# Patient Record
Sex: Female | Born: 1970 | Race: Black or African American | Hispanic: No | Marital: Single | Smoking: Never smoker
Health system: Southern US, Community
[De-identification: ages and names within clinical notes are randomized; demographics above are authoritative.]

## PROBLEM LIST (undated history)

## (undated) DIAGNOSIS — J349 Unspecified disorder of nose and nasal sinuses: Secondary | ICD-10-CM

## (undated) DIAGNOSIS — N764 Abscess of vulva: Secondary | ICD-10-CM

## (undated) DIAGNOSIS — N6019 Diffuse cystic mastopathy of unspecified breast: Secondary | ICD-10-CM

## (undated) DIAGNOSIS — D649 Anemia, unspecified: Secondary | ICD-10-CM

## (undated) DIAGNOSIS — N907 Vulvar cyst: Secondary | ICD-10-CM

## (undated) DIAGNOSIS — M26629 Arthralgia of temporomandibular joint, unspecified side: Secondary | ICD-10-CM

## (undated) DIAGNOSIS — R011 Cardiac murmur, unspecified: Secondary | ICD-10-CM

## (undated) DIAGNOSIS — K219 Gastro-esophageal reflux disease without esophagitis: Secondary | ICD-10-CM

## (undated) HISTORY — DX: Abscess of vulva: N76.4

## (undated) HISTORY — DX: Arthralgia of temporomandibular joint, unspecified side: M26.629

## (undated) HISTORY — DX: Diffuse cystic mastopathy of unspecified breast: N60.19

## (undated) HISTORY — DX: Unspecified disorder of nose and nasal sinuses: J34.9

## (undated) HISTORY — DX: Gastro-esophageal reflux disease without esophagitis: K21.9

## (undated) HISTORY — DX: Cardiac murmur, unspecified: R01.1

## (undated) HISTORY — DX: Anemia, unspecified: D64.9

## (undated) HISTORY — DX: Vulvar cyst: N90.7

---
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
## 1998-08-25 ENCOUNTER — Other Ambulatory Visit: Admission: RE | Admit: 1998-08-25 | Discharge: 1998-08-25 | Payer: Self-pay | Admitting: Obstetrics and Gynecology

## 1999-09-19 ENCOUNTER — Other Ambulatory Visit: Admission: RE | Admit: 1999-09-19 | Discharge: 1999-09-19 | Payer: Self-pay | Admitting: Obstetrics

## 2000-09-17 ENCOUNTER — Other Ambulatory Visit: Admission: RE | Admit: 2000-09-17 | Discharge: 2000-09-17 | Payer: Self-pay | Admitting: Obstetrics

## 2001-04-25 ENCOUNTER — Encounter: Admission: RE | Admit: 2001-04-25 | Discharge: 2001-04-25 | Payer: Self-pay | Admitting: Obstetrics

## 2001-04-25 ENCOUNTER — Encounter: Payer: Self-pay | Admitting: Obstetrics

## 2006-01-25 ENCOUNTER — Emergency Department (HOSPITAL_COMMUNITY): Admission: EM | Admit: 2006-01-25 | Discharge: 2006-01-25 | Payer: Self-pay | Admitting: Family Medicine

## 2006-08-20 ENCOUNTER — Ambulatory Visit (HOSPITAL_COMMUNITY): Admission: RE | Admit: 2006-08-20 | Discharge: 2006-08-20 | Payer: Self-pay | Admitting: Obstetrics and Gynecology

## 2006-08-27 ENCOUNTER — Other Ambulatory Visit: Admission: RE | Admit: 2006-08-27 | Discharge: 2006-08-27 | Payer: Self-pay | Admitting: Obstetrics and Gynecology

## 2008-01-23 ENCOUNTER — Ambulatory Visit (HOSPITAL_COMMUNITY): Admission: RE | Admit: 2008-01-23 | Discharge: 2008-01-23 | Payer: Self-pay | Admitting: Family Medicine

## 2009-02-25 ENCOUNTER — Ambulatory Visit (HOSPITAL_COMMUNITY): Admission: RE | Admit: 2009-02-25 | Discharge: 2009-02-25 | Payer: Self-pay | Admitting: Internal Medicine

## 2009-10-22 DIAGNOSIS — N764 Abscess of vulva: Secondary | ICD-10-CM

## 2009-10-22 HISTORY — DX: Abscess of vulva: N76.4

## 2010-03-30 ENCOUNTER — Ambulatory Visit (HOSPITAL_COMMUNITY): Admission: RE | Admit: 2010-03-30 | Discharge: 2010-03-30 | Payer: Self-pay | Admitting: Internal Medicine

## 2010-10-22 DIAGNOSIS — M26629 Arthralgia of temporomandibular joint, unspecified side: Secondary | ICD-10-CM

## 2010-10-22 HISTORY — DX: Arthralgia of temporomandibular joint, unspecified side: M26.629

## 2011-07-02 ENCOUNTER — Other Ambulatory Visit (HOSPITAL_COMMUNITY): Payer: Self-pay | Admitting: Internal Medicine

## 2011-07-02 DIAGNOSIS — Z1231 Encounter for screening mammogram for malignant neoplasm of breast: Secondary | ICD-10-CM

## 2011-07-03 ENCOUNTER — Ambulatory Visit (HOSPITAL_COMMUNITY)
Admission: RE | Admit: 2011-07-03 | Discharge: 2011-07-03 | Disposition: A | Discharge status: Home / Self Care | Payer: 59 | Source: Ambulatory Visit | Attending: Internal Medicine | Admitting: Internal Medicine

## 2011-07-03 DIAGNOSIS — Z1231 Encounter for screening mammogram for malignant neoplasm of breast: Secondary | ICD-10-CM | POA: Insufficient documentation

## 2011-07-09 ENCOUNTER — Other Ambulatory Visit: Payer: Self-pay | Admitting: Internal Medicine

## 2011-07-09 DIAGNOSIS — R928 Other abnormal and inconclusive findings on diagnostic imaging of breast: Secondary | ICD-10-CM

## 2011-07-12 ENCOUNTER — Emergency Department (HOSPITAL_COMMUNITY)
Admission: EM | Admit: 2011-07-12 | Discharge: 2011-07-13 | Disposition: A | Discharge status: Home / Self Care | Payer: 59 | Attending: Emergency Medicine | Admitting: Emergency Medicine

## 2011-07-12 DIAGNOSIS — R209 Unspecified disturbances of skin sensation: Secondary | ICD-10-CM | POA: Insufficient documentation

## 2011-07-13 ENCOUNTER — Encounter (HOSPITAL_COMMUNITY): Payer: Self-pay

## 2011-07-13 ENCOUNTER — Emergency Department (HOSPITAL_COMMUNITY): Payer: 59

## 2011-07-13 LAB — POCT I-STAT, CHEM 8
Calcium, Ion: 1.22 mmol/L (ref 1.12–1.32)
HCT: 44 % (ref 36.0–46.0)
Sodium: 139 mEq/L (ref 135–145)
TCO2: 24 mmol/L (ref 0–100)

## 2011-07-13 LAB — URINALYSIS, ROUTINE W REFLEX MICROSCOPIC
Ketones, ur: NEGATIVE mg/dL
Leukocytes, UA: NEGATIVE
Nitrite: NEGATIVE
Protein, ur: NEGATIVE mg/dL
Urobilinogen, UA: 1 mg/dL (ref 0.0–1.0)

## 2011-07-13 LAB — POCT PREGNANCY, URINE: Preg Test, Ur: NEGATIVE

## 2011-07-17 ENCOUNTER — Ambulatory Visit
Admission: RE | Admit: 2011-07-17 | Discharge: 2011-07-17 | Disposition: A | Discharge status: Home / Self Care | Payer: 59 | Source: Ambulatory Visit | Attending: Internal Medicine | Admitting: Internal Medicine

## 2011-07-17 ENCOUNTER — Ambulatory Visit: Admission: RE | Admit: 2011-07-17 | Payer: 59 | Source: Ambulatory Visit

## 2011-07-17 ENCOUNTER — Other Ambulatory Visit: Payer: Self-pay | Admitting: Internal Medicine

## 2011-07-17 DIAGNOSIS — R928 Other abnormal and inconclusive findings on diagnostic imaging of breast: Secondary | ICD-10-CM

## 2011-09-06 ENCOUNTER — Other Ambulatory Visit: Payer: Self-pay | Admitting: Diagnostic Neuroimaging

## 2011-09-06 DIAGNOSIS — R42 Dizziness and giddiness: Secondary | ICD-10-CM

## 2011-09-06 DIAGNOSIS — R9089 Other abnormal findings on diagnostic imaging of central nervous system: Secondary | ICD-10-CM

## 2011-09-06 DIAGNOSIS — R200 Anesthesia of skin: Secondary | ICD-10-CM

## 2011-09-10 ENCOUNTER — Ambulatory Visit
Admission: RE | Admit: 2011-09-10 | Discharge: 2011-09-10 | Disposition: A | Discharge status: Home / Self Care | Payer: 59 | Source: Ambulatory Visit | Attending: Diagnostic Neuroimaging | Admitting: Diagnostic Neuroimaging

## 2011-09-10 DIAGNOSIS — R200 Anesthesia of skin: Secondary | ICD-10-CM

## 2011-09-10 DIAGNOSIS — R9089 Other abnormal findings on diagnostic imaging of central nervous system: Secondary | ICD-10-CM

## 2011-09-10 DIAGNOSIS — R42 Dizziness and giddiness: Secondary | ICD-10-CM

## 2011-09-10 NOTE — Patient Instructions (Signed)
Lumbar Puncture Discharge Instructions ° °1. Go home and rest quietly for the next 24 hours.  It is important to lie flat for the next 24 hours.  Get up only to go to the restroom.  You may lie in the bed or on a couch on your back, your stomach, your left side or your right side.  You may have one pillow under your head.  You may have pillows between your knees while you are on your side or under your knees while you are on your back. ° °2. DO NOT drive today.  Recline the seat as far back as it will go, while still wearing your seat belt, on the way home. ° °3. You may get up to go to the bathroom as needed.  You may sit up for 10 minutes to eat.  You may resume your normal diet and medications unless otherwise indicated. ° °4. The incidence of headache, nausea, or vomiting is about 5% (one in 20 patients).  If you develop a headache, lie flat and drink plenty of fluids until the headache goes away.  Caffeinated beverages may be helpful.  If you develop severe nausea and vomiting or a headache that does not go away with flat bed rest, call [phone]. ° °5. You may resume normal activities after your 24 hours of bed rest is over; however, do not exert yourself strongly or do any heavy lifting tomorrow. ° °6. Call your physician for a follow-up appointment.  The results of your myelogram will be sent directly to your physician by the following day. ° °7. If you have any questions or if complications develop after you arrive home, please call [phone]. ° °Discharge instructions have been explained to the patient.  The patient, or the person responsible for the patient, fully understands these instructions.  °

## 2011-09-10 NOTE — Progress Notes (Signed)
Pt's blood drawn for labs from right medial cubital vein, site unremarkable, pt tolerated procedure well. Discharge instructions explained to pt.

## 2011-10-30 ENCOUNTER — Ambulatory Visit (INDEPENDENT_AMBULATORY_CARE_PROVIDER_SITE_OTHER): Payer: 59 | Admitting: Surgery

## 2011-10-30 ENCOUNTER — Encounter (INDEPENDENT_AMBULATORY_CARE_PROVIDER_SITE_OTHER): Payer: Self-pay | Admitting: Surgery

## 2011-10-30 VITALS — BP 120/82 | HR 72 | Resp 16 | Ht 63.0 in | Wt 167.0 lb

## 2011-10-30 DIAGNOSIS — N6019 Diffuse cystic mastopathy of unspecified breast: Secondary | ICD-10-CM

## 2011-10-30 NOTE — Patient Instructions (Signed)
Be sure you have your mammogram as scheduled and see me if you have any other concerns with your breasts

## 2011-10-30 NOTE — Progress Notes (Signed)
NAME: Allison Farrell                                                                                      DOB: 13-Aug-1971 DATE: 10/30/2011               MRN: [id number]   CC:  Chief Complaint  Patient presents with  . Breast Pain    right    HPI:  Allison Farrell is a 41 y.o.  female who was referred  by Dr. Allen Derry evaluation of breast discomfort. She has been having painin her breast, right more than left. It is an aching, but sometimes more of a burning. She is not sure if it is related to menstrual cycle. She had a mammogram and ultrasound in Sep 2012 that was negative, but six month F/U recommended. She has no mass or nipple discharge. Her mother died at about 24 of breast cancer  PMH:  has a past medical history of Anemia.  PSH:   has past surgical history that includes Knee surgery (1990) and Wisdom tooth extraction (1991).  ALLERGIES:   Allergies  Allergen Reactions  . Shellfish Allergy Hives and Swelling  . Penicillins     Does not remember possibly when child    MEDICATIONS: Current outpatient prescriptions:acetaminophen (TYLENOL) 325 MG tablet, Take 650 mg by mouth as needed.  , Disp: , Rfl: ;  Multiple Vitamin (MULTIVITAMIN) tablet, Take 1 tablet by mouth daily.  , Disp: , Rfl:   ROS: She has filled out our 12 point review of systems and it is negative except for weakness   EXAM:   GENERAL:  The patient is alert, oriented, and generally healthy-appearing, NAD. Mood and affect are normal.  BREASTS:  Symmetric, non tender, firm, diffusely nodular, no dominant mass, not tender, no discharge, no skin changes  LYMPHATICS: No axillary or supraclavicular adenopathy    DATA REVIEWED:  Mammogram and ultrasound report  IMPRESSION:  Fibrocystic disease  PLAN:   No surgical indication. Discussed F/C disease and recommended she have mammogram as scheduled. Will see her PRN any other problems or concerns  Kerrie Timm J 10/30/2011  CC: Henreitta Leber, PA,  Elby Showers, MD, MD

## 2011-12-19 ENCOUNTER — Other Ambulatory Visit: Payer: Self-pay | Admitting: Internal Medicine

## 2011-12-19 DIAGNOSIS — R928 Other abnormal and inconclusive findings on diagnostic imaging of breast: Secondary | ICD-10-CM

## 2011-12-28 ENCOUNTER — Other Ambulatory Visit: Payer: Self-pay | Admitting: Internal Medicine

## 2011-12-28 DIAGNOSIS — S0300XA Dislocation of jaw, unspecified side, initial encounter: Secondary | ICD-10-CM

## 2012-01-03 ENCOUNTER — Ambulatory Visit
Admission: RE | Admit: 2012-01-03 | Discharge: 2012-01-03 | Disposition: A | Discharge status: Home / Self Care | Payer: 59 | Source: Ambulatory Visit | Attending: Internal Medicine | Admitting: Internal Medicine

## 2012-01-03 DIAGNOSIS — S0300XA Dislocation of jaw, unspecified side, initial encounter: Secondary | ICD-10-CM

## 2012-01-15 ENCOUNTER — Other Ambulatory Visit: Payer: Self-pay | Admitting: Internal Medicine

## 2012-01-15 ENCOUNTER — Ambulatory Visit
Admission: RE | Admit: 2012-01-15 | Discharge: 2012-01-15 | Disposition: A | Discharge status: Home / Self Care | Payer: 59 | Source: Ambulatory Visit | Attending: Internal Medicine | Admitting: Internal Medicine

## 2012-01-15 DIAGNOSIS — R928 Other abnormal and inconclusive findings on diagnostic imaging of breast: Secondary | ICD-10-CM

## 2012-03-11 ENCOUNTER — Encounter: Payer: Self-pay | Admitting: Obstetrics and Gynecology

## 2012-03-11 ENCOUNTER — Telehealth: Payer: Self-pay | Admitting: Obstetrics and Gynecology

## 2012-03-11 ENCOUNTER — Ambulatory Visit (INDEPENDENT_AMBULATORY_CARE_PROVIDER_SITE_OTHER): Payer: 59 | Admitting: Obstetrics and Gynecology

## 2012-03-11 VITALS — BP 110/72 | HR 78 | Ht 63.0 in | Wt 165.0 lb

## 2012-03-11 DIAGNOSIS — D649 Anemia, unspecified: Secondary | ICD-10-CM

## 2012-03-11 DIAGNOSIS — B356 Tinea cruris: Secondary | ICD-10-CM

## 2012-03-11 MED ORDER — NYSTATIN-TRIAMCINOLONE 100000-0.1 UNIT/GM-% EX OINT: TOPICAL_OINTMENT | Freq: Two times a day (BID) | CUTANEOUS | Status: DC

## 2012-03-11 NOTE — Patient Instructions (Signed)
Keep rash clean and dry.  Use cream twice daily for 7-14 days. Once healed, use Zeabsorb Powder to keep the area dry during  exercise and summer heat.

## 2012-03-11 NOTE — Progress Notes (Signed)
40 YO complains of pruritic rash on buttocks x 1 week. Started to improve but last night seemingly erupted.  Used tea tree oil, aloe vera gel, cortisone cream, H202, alcohol and antifungal cream.  Initially t was sore and saw a little blood on tissue.  Now feels like it is on fire.  Denies fever.   O: Buttocks: sacral area of natal cleft with well circumscribed pigmented dry macular rash with mild flaking on edges.  A: Tinea Cruris  P: Perineal Hygiene     Mycolog II (generic) topically to affected area bid x 7-     14 days no refills     Once area is healed, use Zeabsorb Powder in area     RTO as scheduled or prn     Melania Kirks J. Lowell Guitar, PA-C

## 2012-08-25 ENCOUNTER — Ambulatory Visit: Payer: 59 | Admitting: Obstetrics and Gynecology

## 2012-08-26 ENCOUNTER — Encounter: Payer: 59 | Admitting: Obstetrics and Gynecology

## 2012-08-26 ENCOUNTER — Ambulatory Visit: Payer: 59 | Admitting: Obstetrics and Gynecology

## 2012-09-17 ENCOUNTER — Encounter: Payer: Self-pay | Admitting: Obstetrics and Gynecology

## 2012-10-01 ENCOUNTER — Encounter: Payer: 59 | Admitting: Obstetrics and Gynecology

## 2012-10-23 ENCOUNTER — Ambulatory Visit: Payer: 59 | Admitting: Obstetrics and Gynecology

## 2012-11-19 ENCOUNTER — Ambulatory Visit: Payer: BC Managed Care – PPO | Admitting: Obstetrics and Gynecology

## 2012-11-19 ENCOUNTER — Encounter: Payer: Self-pay | Admitting: Obstetrics and Gynecology

## 2012-11-19 VITALS — BP 110/68 | Temp 98.3°F | Ht 63.0 in | Wt 173.0 lb

## 2012-11-19 DIAGNOSIS — Z01419 Encounter for gynecological examination (general) (routine) without abnormal findings: Secondary | ICD-10-CM

## 2012-11-19 DIAGNOSIS — Z124 Encounter for screening for malignant neoplasm of cervix: Secondary | ICD-10-CM

## 2012-11-19 NOTE — Progress Notes (Signed)
Regular Periods: yes Mammogram: yes  Monthly Breast Ex.: yes Exercise: yes  Tetanus < 10 years: yes Seatbelts: yes  NI. Bladder Functn.: yes Abuse at home: no  Daily BM's: yes Stressful Work: yes  Healthy Diet: yes Sigmoid-Colonoscopy: NO  Calcium: no Medical problems this year: NONE   LAST PAP:9/12  Contraception: ABST  Mammogram:  2012;FIROCYSTIC CHANGES  PCP: NO  PMH: NO CHANGE  FMH: NO CHANGE  Last Bone Scan: NO  PT IS SINGLE

## 2012-11-19 NOTE — Progress Notes (Signed)
Subjective:    Allison Farrell is a 42 y.o. female, G0P0, who presents for an annual exam. The patient reports no complaints but is considering having the "bumps" on her vaginal area removed.  In the past they have become infected and hurt but now they are "just there".  Menstrual cycle:   LMP: Patient's last menstrual period was 11/10/2012.             Review of Systems Pertinent items are noted in HPI. Denies pelvic pain, urinary tract symptoms, vaginitis symptoms, irregular bleeding, menopausal symptoms, change in bowel habits or rectal bleeding   Objective:    BP 110/68  Temp 98.3 F (36.8 C) (Oral)  Ht 5\' 3"  (1.6 m)  Wt 173 lb (78.472 kg)  BMI 30.65 kg/m2  LMP 11/10/2012    Wt Readings from Last 1 Encounters:  11/19/12 173 lb (78.472 kg)   Body mass index is 30.65 kg/(m^2). General Appearance: Alert, no acute distress HEENT: Grossly normal Neck / Thyroid: Supple, no thyromegaly or cervical adenopathy Lungs: Clear to auscultation bilaterally Back: No CVA tenderness Breast Exam: No masses or nodes.No dimpling, nipple retraction or discharge. Cardiovascular: Regular rate and rhythm. 2/6 SEM Gastrointestinal: Soft, non-tender, no masses or organomegaly Pelvic Exam: EGBUS-wnl except left inferior labia majora-2 mm inclusion cyst; left upper-inner labia majora with 2 mm inclusion cyst and right labial apex with 1 mm inclusion cyst;  vagina-normal rugae, cervix- without lesions or tenderness, uterus appears normal size shape and consistency, adnexae-no masses or tenderness Lymphatic Exam: Non-palpable nodes in neck, clavicular,  axillary, or inguinal regions  Skin: no rashes or abnormalities Extremities: no clubbing cyanosis or edema  Neurologic: grossly normal Psychiatric: Alert and oriented     Assessment:   Routine GYN Exam Labial Inclusion Cysts   Plan:  Patient may want inclusion cysts removed, has seen Dr. Stefano Gaul in the past  Requests names of PCPs, gave patient  the # for [facility] [phone]  PAP sent  RTO 1 year or prn  Corrion Stirewalt,ELMIRAPA-C

## 2012-11-20 LAB — PAP IG W/ RFLX HPV ASCU

## 2012-12-29 ENCOUNTER — Other Ambulatory Visit: Payer: Self-pay | Admitting: Obstetrics and Gynecology

## 2013-07-23 ENCOUNTER — Ambulatory Visit (HOSPITAL_COMMUNITY)
Admission: RE | Admit: 2013-07-23 | Discharge: 2013-07-23 | Disposition: A | Discharge status: Home / Self Care | Payer: BC Managed Care – PPO | Source: Ambulatory Visit | Attending: Obstetrics and Gynecology | Admitting: Obstetrics and Gynecology

## 2013-07-23 DIAGNOSIS — Z1231 Encounter for screening mammogram for malignant neoplasm of breast: Secondary | ICD-10-CM | POA: Insufficient documentation

## 2013-11-02 ENCOUNTER — Emergency Department (HOSPITAL_COMMUNITY)
Admission: EM | Admit: 2013-11-02 | Discharge: 2013-11-02 | Disposition: A | Discharge status: Home / Self Care | Payer: BC Managed Care – PPO | Source: Home / Self Care

## 2014-08-16 ENCOUNTER — Encounter: Payer: Self-pay | Admitting: Podiatry

## 2014-08-16 ENCOUNTER — Ambulatory Visit (INDEPENDENT_AMBULATORY_CARE_PROVIDER_SITE_OTHER): Payer: BC Managed Care – PPO | Admitting: Podiatry

## 2014-08-16 VITALS — BP 136/68 | HR 62 | Resp 16 | Ht 63.0 in | Wt 180.0 lb

## 2014-08-16 DIAGNOSIS — B351 Tinea unguium: Secondary | ICD-10-CM

## 2014-08-16 NOTE — Progress Notes (Signed)
Subjective:     Patient ID: Allison Farrell, female   DOB: January 10, 1971, 43 y.o.   MRN: [id number]  HPI patient presents with a dark left big toenail secondary to probable trauma with moderate fungal infiltration. States that she does not like the way it looks and also has some discoloration on the fifth nail   Review of Systems  All other systems reviewed and are negative.      Objective:   Physical Exam  Nursing note and vitals reviewed. Constitutional: She is oriented to person, place, and time.  Cardiovascular: Intact distal pulses.   Musculoskeletal: Normal range of motion.  Neurological: She is oriented to person, place, and time.  Skin: Skin is warm.   neurovascular status found to be intact with muscle strength adequate and range of motion subtalar and midtarsal joint within normal limits. Patient is noted to have a discolored left big toenail that is crusted and yellow with brittle debris noted and also moderate on the left fifth nail     Assessment:     Probable trauma with mycotic nail infection left big toenail and left fifth nail    Plan:     Reviewed condition and discussed treatment options including different topical antifungals or laser. At this time we have opted on topical and laser and I did explain this may or may not cure her condition area may consider oral at one point in the future

## 2014-08-16 NOTE — Progress Notes (Signed)
   Subjective:    Patient ID: Allison Farrell, female    DOB: 1971/07/31, 43 y.o.   MRN: [id number]  HPI Comments: "I have this bad nail"  Patient c/o thick, dark nail, 1st toe left, for several years. She has tried OTC meds-no help.     Review of Systems  Allergic/Immunologic: Positive for food allergies.  All other systems reviewed and are negative.      Objective:   Physical Exam        Assessment & Plan:

## 2014-08-16 NOTE — Patient Instructions (Signed)

## 2014-08-26 ENCOUNTER — Ambulatory Visit: Payer: BC Managed Care – PPO | Admitting: Podiatry

## 2014-08-26 ENCOUNTER — Encounter: Payer: Self-pay | Admitting: Podiatry

## 2014-08-26 DIAGNOSIS — B351 Tinea unguium: Secondary | ICD-10-CM

## 2014-08-26 NOTE — Patient Instructions (Signed)

## 2014-08-27 NOTE — Progress Notes (Signed)
Subjective:     Patient ID: Allison Farrell, female   DOB: 03/17/1971, 43 y.o.   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

## 2014-09-07 ENCOUNTER — Other Ambulatory Visit (HOSPITAL_COMMUNITY): Payer: Self-pay | Admitting: Obstetrics and Gynecology

## 2014-09-07 DIAGNOSIS — Z1231 Encounter for screening mammogram for malignant neoplasm of breast: Secondary | ICD-10-CM

## 2014-09-20 ENCOUNTER — Telehealth: Payer: Self-pay | Admitting: *Deleted

## 2014-09-20 NOTE — Telephone Encounter (Signed)
Pt states her checkout paperwork states her next laser appt #2 was to be 6 weeks out, but her appt is this Thursday 09/23/2014, which is 4 weeks.  Is that okay?  Dr. Paulla Dolly states okay.  Please leave a message.  I left a message with Dr. Mellody Drown statement.

## 2014-09-23 ENCOUNTER — Ambulatory Visit (INDEPENDENT_AMBULATORY_CARE_PROVIDER_SITE_OTHER): Payer: BC Managed Care – PPO | Admitting: Podiatry

## 2014-09-23 DIAGNOSIS — B351 Tinea unguium: Secondary | ICD-10-CM

## 2014-09-23 NOTE — Progress Notes (Signed)
Subjective:     Patient ID: Allison Farrell, female   DOB: 1971/05/25, 43 y.o.   MRN: [id number]  HPI patient states I'm improving with my nails but still present   Review of Systems     Objective:   Physical Exam Neurovascular status intact with discoloration of the big toenail fifth nail left localized in nature    Assessment:     Mycotic nail infection that appears to be improving    Plan:     Laser to the left first and fifth nailbeds approximate 1100 pulses

## 2014-09-29 ENCOUNTER — Ambulatory Visit (HOSPITAL_COMMUNITY)
Admission: RE | Admit: 2014-09-29 | Discharge: 2014-09-29 | Disposition: A | Discharge status: Home / Self Care | Payer: BC Managed Care – PPO | Source: Ambulatory Visit | Attending: Obstetrics and Gynecology | Admitting: Obstetrics and Gynecology

## 2014-09-29 DIAGNOSIS — Z1231 Encounter for screening mammogram for malignant neoplasm of breast: Secondary | ICD-10-CM | POA: Diagnosis present

## 2015-01-27 ENCOUNTER — Ambulatory Visit: Payer: BC Managed Care – PPO | Admitting: Podiatry

## 2015-09-09 ENCOUNTER — Other Ambulatory Visit: Payer: Self-pay | Admitting: Obstetrics and Gynecology

## 2015-09-09 DIAGNOSIS — Z803 Family history of malignant neoplasm of breast: Secondary | ICD-10-CM

## 2015-10-03 ENCOUNTER — Ambulatory Visit
Admission: RE | Admit: 2015-10-03 | Discharge: 2015-10-03 | Disposition: A | Discharge status: Home / Self Care | Payer: BC Managed Care – PPO | Source: Ambulatory Visit | Attending: Obstetrics and Gynecology | Admitting: Obstetrics and Gynecology

## 2015-10-03 DIAGNOSIS — Z803 Family history of malignant neoplasm of breast: Secondary | ICD-10-CM

## 2015-10-03 MED ORDER — GADOBENATE DIMEGLUMINE 529 MG/ML IV SOLN
17.0000 mL | Freq: Once | INTRAVENOUS | Status: AC | PRN
  Administered 2015-10-03: 17 mL via INTRAVENOUS

## 2015-10-27 ENCOUNTER — Ambulatory Visit: Payer: BC Managed Care – PPO | Admitting: Podiatry

## 2015-10-31 ENCOUNTER — Ambulatory Visit (INDEPENDENT_AMBULATORY_CARE_PROVIDER_SITE_OTHER): Payer: BC Managed Care – PPO | Admitting: Emergency Medicine

## 2015-10-31 VITALS — BP 168/90 | HR 63 | Temp 98.8°F | Resp 16 | Ht 63.5 in | Wt 176.0 lb

## 2015-10-31 DIAGNOSIS — K2970 Gastritis, unspecified, without bleeding: Secondary | ICD-10-CM | POA: Diagnosis not present

## 2015-10-31 DIAGNOSIS — N6019 Diffuse cystic mastopathy of unspecified breast: Secondary | ICD-10-CM | POA: Diagnosis not present

## 2015-10-31 DIAGNOSIS — K2990 Gastroduodenitis, unspecified, without bleeding: Secondary | ICD-10-CM

## 2015-10-31 LAB — CBC
HEMATOCRIT: 42.6 % (ref 36.0–46.0)
HEMOGLOBIN: 13.1 g/dL (ref 12.0–15.0)
MCH: 22.7 pg — AB (ref 26.0–34.0)
MCHC: 30.8 g/dL (ref 30.0–36.0)
MCV: 73.8 fL — AB (ref 78.0–100.0)
MPV: 9.6 fL (ref 8.6–12.4)
Platelets: 340 10*3/uL (ref 150–400)
RBC: 5.77 MIL/uL — AB (ref 3.87–5.11)
RDW: 14.1 % (ref 11.5–15.5)
WBC: 4.5 10*3/uL (ref 4.0–10.5)

## 2015-10-31 LAB — COMPREHENSIVE METABOLIC PANEL
ALBUMIN: 3.7 g/dL (ref 3.6–5.1)
ALK PHOS: 50 U/L (ref 33–115)
ALT: 16 U/L (ref 6–29)
AST: 15 U/L (ref 10–30)
BUN: 11 mg/dL (ref 7–25)
CALCIUM: 9.2 mg/dL (ref 8.6–10.2)
CHLORIDE: 102 mmol/L (ref 98–110)
CO2: 28 mmol/L (ref 20–31)
Creat: 0.73 mg/dL (ref 0.50–1.10)
Glucose, Bld: 88 mg/dL (ref 65–99)
POTASSIUM: 4.4 mmol/L (ref 3.5–5.3)
Sodium: 137 mmol/L (ref 135–146)
TOTAL PROTEIN: 7.1 g/dL (ref 6.1–8.1)
Total Bilirubin: 0.4 mg/dL (ref 0.2–1.2)

## 2015-10-31 MED ORDER — LANSOPRAZOLE 30 MG PO CPDR: 30.0000 mg | DELAYED_RELEASE_CAPSULE | Freq: Every day | ORAL | Status: DC

## 2015-10-31 NOTE — Progress Notes (Signed)
Subjective:  Patient ID: Allison Farrell, female    DOB: Sep 14, 1971  Age: 45 y.o. MRN: [id number]  CC: Itching under arms; Breast Pain; and Burning in back   HPI Allison Farrell presents   Patient has a number of unrelated complaints. She had no nausea vomiting diarrhea that was self-limited back just before Christmas. She is concerned that she doesn't "feel well. Although her symptoms of been resolved For several weeks. She has epigastric discomfort. Denies any alcohol or excess caffeine overuse.  She does nottake Aleve or ibuprofen or aspirin in excess. She had poor appetite. She's not taking any medications that are unprovoked of pancreatitis. Has no fever or chills. Has no blood in her stool or black stools   she has a history of fibrocystic disease in her in her breast and has noted increased pain in the right breast. Is no drainage or redness or swelling.    he was treated previously with a fungus infection by her gynecologist in her axillae. Treated with a antifungal powder and her symptoms resolved. Now she's com back in with recurrence although it's largely resolved again with another round of antifungal powder  History Allison Farrell has a past medical history of Anemia; Fibrocystic breast; TMJ arthralgia (2012); Labial abscess (2011); and Inclusion cyst of vulva.   She has past surgical history that includes Knee surgery (1990) and Wisdom tooth extraction (1991).   Her  family history includes Breast cancer (age of onset: 23) in her mother; Cancer in her father and mother; Diabetes in her brother and father; Hypertension in her father; Kidney disease in her paternal grandmother; Lung cancer in her father; Stroke in her maternal grandmother and paternal grandmother.  She   reports that she has never smoked. She has never used smokeless tobacco. She reports that she drinks about 1.8 oz of alcohol per week. She reports that she does not use illicit drugs.  Outpatient Prescriptions Prior to Visit   Medication Sig Dispense Refill  . Multiple Vitamin (MULTIVITAMIN) tablet Take 1 tablet by mouth daily. Reported on 10/31/2015     No facility-administered medications prior to visit.    Social History   Social History  . Marital Status: Single    Spouse Name: N/A  . Number of Children: N/A  . Years of Education: N/A   Social History Main Topics  . Smoking status: Never Smoker   . Smokeless tobacco: Never Used  . Alcohol Use: 1.8 oz/week    3 Standard drinks or equivalent per week     Comment: occasionally  . Drug Use: No  . Sexual Activity: Not Currently    Birth Control/ Protection: Abstinence   Other Topics Concern  . None   Social History Narrative     Review of Systems  Constitutional: Positive for appetite change. Negative for fever and chills.  HENT: Negative for congestion, ear pain, postnasal drip, sinus pressure and sore throat.   Eyes: Negative for pain and redness.  Respiratory: Negative for cough, shortness of breath and wheezing.   Cardiovascular: Negative for leg swelling.  Gastrointestinal: Positive for nausea. Negative for vomiting, abdominal pain, diarrhea, constipation and blood in stool.  Endocrine: Negative for polyuria.  Genitourinary: Negative for dysuria, urgency, frequency and flank pain.  Musculoskeletal: Negative for gait problem.  Skin: Negative for rash.  Neurological: Negative for weakness and headaches.  Psychiatric/Behavioral: Negative for confusion and decreased concentration. The patient is not nervous/anxious.     Objective:  BP 150/94 mmHg  Pulse 63  Temp(Src) 98.8 F (37.1 C) (Oral)  Resp 16  Ht 5' 3.5" (1.613 m)  Wt 176 lb (79.833 kg)  BMI 30.68 kg/m2  SpO2 97%  LMP 10/17/2015  Physical Exam  Constitutional: She is oriented to person, place, and time. She appears well-developed and well-nourished. No distress.  HENT:  Head: Normocephalic and atraumatic.  Right Ear: External ear normal.  Left Ear: External ear normal.    Nose: Nose normal.  Eyes: Conjunctivae and EOM are normal. Pupils are equal, round, and reactive to light. No scleral icterus.  Neck: Normal range of motion. Neck supple. No tracheal deviation present.  Cardiovascular: Normal rate, regular rhythm and normal heart sounds.   Pulmonary/Chest: Effort normal. No respiratory distress. She has no wheezes. She has no rales. Right breast exhibits mass (Diffuse fibrocystic disease).  Abdominal: She exhibits no mass. There is no tenderness. There is no rebound and no guarding.  Musculoskeletal: She exhibits no edema.  Lymphadenopathy:    She has no cervical adenopathy.  Neurological: She is alert and oriented to person, place, and time. Coordination normal.  Skin: Skin is warm and dry. No rash noted.  Psychiatric: She has a normal mood and affect. Her behavior is normal.      Assessment & Plan:   Larra was seen today for itching under arms, breast pain and burning in back.  Diagnoses and all orders for this visit:  Gastritis and gastroduodenitis -     CBC -     Comprehensive metabolic panel  Fibrocystic breast disease, unspecified laterality -     CBC -     Comprehensive metabolic panel  Other orders -     lansoprazole (PREVACID) 30 MG capsule; Take 1 capsule (30 mg total) by mouth daily at 12 noon.  I am having Allison Farrell start on lansoprazole. I am also having her maintain her multivitamin.  Meds ordered this encounter  Medications  . lansoprazole (PREVACID) 30 MG capsule    Sig: Take 1 capsule (30 mg total) by mouth daily at 12 noon.    Dispense:  30 capsule    Refill:  5    Appropriate red flag conditions were discussed with the patient as well as actions that should be taken.  Patient expressed his understanding.  Follow-up: Return if symptoms worsen or fail to improve.  Roselee Culver, MD

## 2015-10-31 NOTE — Patient Instructions (Signed)

## 2015-11-02 ENCOUNTER — Ambulatory Visit (INDEPENDENT_AMBULATORY_CARE_PROVIDER_SITE_OTHER): Payer: BC Managed Care – PPO | Admitting: Podiatry

## 2015-11-02 ENCOUNTER — Encounter: Payer: Self-pay | Admitting: Podiatry

## 2015-11-02 VITALS — BP 127/74 | HR 67 | Resp 16

## 2015-11-02 DIAGNOSIS — B351 Tinea unguium: Secondary | ICD-10-CM

## 2015-11-02 MED ORDER — TERBINAFINE HCL 250 MG PO TABS: ORAL_TABLET | ORAL | Status: DC

## 2015-11-02 NOTE — Progress Notes (Signed)
Subjective:     Patient ID: Allison Farrell, female   DOB: 24-May-1971, 45 y.o.   MRN: [id number]  HPI patient presents for third laser treatment stating there might be mild improvement but there still nail disease and also I'm getting some skin irritation   Review of Systems     Objective:   Physical Exam Neurovascular status unchanged with nail disease hallux fifth nail left and mild skin changes which can be a fungal type element    Assessment:     Mycotic disease along with fungal element    Plan:     Reviewed condition laser will be administered today we'll start on a pulse Lamisil therapy. Patient be seen back to recheck again in 4 months

## 2016-02-09 ENCOUNTER — Ambulatory Visit (INDEPENDENT_AMBULATORY_CARE_PROVIDER_SITE_OTHER): Payer: BC Managed Care – PPO | Admitting: Physician Assistant

## 2016-02-09 VITALS — BP 122/72 | HR 60 | Temp 100.6°F | Resp 18 | Ht 63.5 in | Wt 180.8 lb

## 2016-02-09 DIAGNOSIS — E860 Dehydration: Secondary | ICD-10-CM | POA: Diagnosis not present

## 2016-02-09 DIAGNOSIS — J111 Influenza due to unidentified influenza virus with other respiratory manifestations: Secondary | ICD-10-CM | POA: Diagnosis not present

## 2016-02-09 DIAGNOSIS — R319 Hematuria, unspecified: Secondary | ICD-10-CM | POA: Diagnosis not present

## 2016-02-09 DIAGNOSIS — R69 Illness, unspecified: Principal | ICD-10-CM

## 2016-02-09 LAB — POCT URINALYSIS DIP (MANUAL ENTRY)
Glucose, UA: NEGATIVE
Leukocytes, UA: NEGATIVE
Nitrite, UA: NEGATIVE
UROBILINOGEN UA: 0.2
pH, UA: 5.5

## 2016-02-09 LAB — POCT CBC
GRANULOCYTE PERCENT: 82.3 % — AB (ref 37–80)
HCT, POC: 39.8 % (ref 37.7–47.9)
HEMOGLOBIN: 13.2 g/dL (ref 12.2–16.2)
Lymph, poc: 0.5 — AB (ref 0.6–3.4)
MCH: 23.8 pg — AB (ref 27–31.2)
MCHC: 33.2 g/dL (ref 31.8–35.4)
MCV: 71.8 fL — AB (ref 80–97)
MID (cbc): 0.3 (ref 0–0.9)
MPV: 6.6 fL (ref 0–99.8)
PLATELET COUNT, POC: 213 10*3/uL (ref 142–424)
POC GRANULOCYTE: 3.8 (ref 2–6.9)
POC LYMPH %: 10.8 % (ref 10–50)
POC MID %: 6.9 %M (ref 0–12)
RBC: 5.54 M/uL — AB (ref 4.04–5.48)
RDW, POC: 14 %
WBC: 4.6 10*3/uL (ref 4.6–10.2)

## 2016-02-09 LAB — POCT INFLUENZA A/B
INFLUENZA A, POC: NEGATIVE
Influenza B, POC: NEGATIVE

## 2016-02-09 LAB — POC MICROSCOPIC URINALYSIS (UMFC)

## 2016-02-09 MED ORDER — OSELTAMIVIR PHOSPHATE 75 MG PO CAPS
75.0000 mg | ORAL_CAPSULE | Freq: Two times a day (BID) | ORAL | Status: DC
Start: 2016-02-09 — End: 2016-02-11

## 2016-02-09 NOTE — Progress Notes (Signed)
02/09/2016 10:15 AM   DOB: 08/19/1971 / MRN: [id number]  SUBJECTIVE:  Allison Farrell is a 45 y.o. female presenting for body aches about her neck and hips, HA, and fatigue that started 48 hours ago.  She has not had the seasonal flu shot and works in a middle school.  She associates fever, nausea and dry heaving. Has tried OTC cough prep with some relief of her symptoms.    She is allergic to shellfish allergy and penicillins.   She  has a past medical history of Anemia; Fibrocystic breast; TMJ arthralgia (2012); Labial abscess (2011); and Inclusion cyst of vulva.    She  reports that she has never smoked. She has never used smokeless tobacco. She reports that she drinks about 1.8 oz of alcohol per week. She reports that she does not use illicit drugs. She  reports that she does not currently engage in sexual activity. She reports using the following method of birth control/protection: Abstinence. The patient  has past surgical history that includes Knee surgery (1990) and Wisdom tooth extraction (1991).  Her family history includes Breast cancer (age of onset: 16) in her mother; Cancer in her father and mother; Diabetes in her brother and father; Hypertension in her father; Kidney disease in her paternal grandmother; Lung cancer in her father; Stroke in her maternal grandmother and paternal grandmother.  Review of Systems  Constitutional: Negative for diaphoresis.  HENT: Positive for congestion. Negative for sore throat.   Respiratory: Negative for cough and shortness of breath.   Cardiovascular: Negative for chest pain.  Genitourinary: Negative for dysuria, urgency and frequency.  Skin: Negative for rash.  Neurological: Negative for dizziness.    Problem list and medications reviewed and updated by myself where necessary, and exist elsewhere in the encounter.   OBJECTIVE:  BP 122/72 mmHg  Pulse 60  Temp(Src) 100.6 F (38.1 C) (Oral)  Resp 18  Ht 5' 3.5" (1.613 m)  Wt 180 lb 12.8 oz  (82.01 kg)  BMI 31.52 kg/m2  SpO2 98%  LMP 01/27/2016  Physical Exam  Constitutional: She is oriented to person, place, and time. She appears well-nourished. No distress.  Eyes: EOM are normal. Pupils are equal, round, and reactive to light.  Cardiovascular: Normal rate and regular rhythm.   Pulmonary/Chest: Effort normal and breath sounds normal. No respiratory distress. She has no wheezes. She has no rales. She exhibits no tenderness.  Abdominal: She exhibits no distension and no mass. There is no tenderness. There is no rebound and no guarding.  Neurological: She is alert and oriented to person, place, and time. No cranial nerve deficit. Gait normal.  Skin: Skin is dry. She is not diaphoretic.  Psychiatric: She has a normal mood and affect.  Vitals reviewed.   Results for orders placed or performed in visit on 02/09/16 (from the past 72 hour(s))  POCT Influenza A/B     Status: None   Collection Time: 02/09/16  8:55 AM  Result Value Ref Range   Influenza A, POC Negative Negative   Influenza B, POC Negative Negative  POCT CBC     Status: Abnormal   Collection Time: 02/09/16  8:55 AM  Result Value Ref Range   WBC 4.6 4.6 - 10.2 K/uL   Lymph, poc 0.5 (A) 0.6 - 3.4   POC LYMPH PERCENT 10.8 10 - 50 %L   MID (cbc) 0.3 0 - 0.9   POC MID % 6.9 0 - 12 %M   POC Granulocyte 3.8 2 -  6.9   Granulocyte percent 82.3 (A) 37 - 80 %G   RBC 5.54 (A) 4.04 - 5.48 M/uL   Hemoglobin 13.2 12.2 - 16.2 g/dL   HCT, POC 39.8 37.7 - 47.9 %   MCV 71.8 (A) 80 - 97 fL   MCH, POC 23.8 (A) 27 - 31.2 pg   MCHC 33.2 31.8 - 35.4 g/dL   RDW, POC 14.0 %   Platelet Count, POC 213 142 - 424 K/uL   MPV 6.6 0 - 99.8 fL  POCT urinalysis dipstick     Status: Abnormal   Collection Time: 02/09/16  8:55 AM  Result Value Ref Range   Color, UA yellow yellow   Clarity, UA clear clear   Glucose, UA negative negative   Bilirubin, UA small (A) negative   Ketones, POC UA small (15) (A) negative   Spec Grav, UA >=1.030     Blood, UA large (A) negative   pH, UA 5.5    Protein Ur, POC =100 (A) negative   Urobilinogen, UA 0.2    Nitrite, UA Negative Negative   Leukocytes, UA Negative Negative  POCT Microscopic Urinalysis ([facility])     Status: Abnormal   Collection Time: 02/09/16  9:37 AM  Result Value Ref Range   WBC,UR,HPF,POC None None WBC/hpf   RBC,UR,HPF,POC Moderate (A) None RBC/hpf   Bacteria None None, Too numerous to count   Mucus Present (A) Absent   Epithelial Cells, UR Per Microscopy Few (A) None, Too numerous to count cells/hpf    No results found.  ASSESSMENT AND PLAN  Allison Farrell was seen today for generalized body aches, nausea and chills.  Diagnoses and all orders for this visit:  Influenza-like illness: Rapid negative however CBC points in a direction of flu.  Her symptoms are consistent with flu B.  Will treat.  She is dehydrated per her urine.  Will provide one liter here today and advised that she stay out of work until Monday and focus on hydration and rest.  RTC if symptoms change or worsen.   -     POCT Influenza A/B -     POCT CBC -     POCT urinalysis dipstick -     oseltamivir (TAMIFLU) 75 MG capsule; Take 1 capsule (75 mg total) by mouth 2 (two) times daily.  Dehydration -     Insert peripheral IV  Hematuria:  Doubt UTI as there are no leuks or nitrites.  She has no symptoms of UTI. CBC points in a viral direction and her urine shows significant dehydration.  Will re-eval the urine in 2 days.  I can not rule out a stone at this time however it would be an odd presentation.      The patient was advised to call or return to clinic if she does not see an improvement in symptoms or to seek the care of the closest emergency department if she worsens with the above plan.   Philis Fendt, MHS, PA-C [facility] 02/09/2016 10:15 AM

## 2016-02-09 NOTE — Patient Instructions (Addendum)
Please take 600-800 mg of Ibuprofen every 8 hours.  Please focus on taking small sips of fluid often while resting at home.  If your symptoms change or worsen over the next few days then please return to clinic.      IF you received an x-ray today, you will receive an invoice from [facility]. Please contact [facility] at [phone] with questions or concerns regarding your invoice.   IF you received labwork today, you will receive an invoice from [employer]. Please contact [facility] at [phone] with questions or concerns regarding your invoice.   Our billing staff will not be able to assist you with questions regarding bills from these companies.  You will be contacted with the lab results as soon as they are available. The fastest way to get your results is to activate your My Chart account. Instructions are located on the last page of this paperwork. If you have not heard from [place] regarding the results in 2 weeks, please contact this office.

## 2016-02-11 ENCOUNTER — Ambulatory Visit (INDEPENDENT_AMBULATORY_CARE_PROVIDER_SITE_OTHER): Payer: BC Managed Care – PPO

## 2016-02-11 ENCOUNTER — Ambulatory Visit (HOSPITAL_COMMUNITY)
Admission: EM | Admit: 2016-02-11 | Discharge: 2016-02-11 | Disposition: A | Discharge status: Home / Self Care | Payer: BC Managed Care – PPO | Attending: Emergency Medicine | Admitting: Emergency Medicine

## 2016-02-11 ENCOUNTER — Encounter (HOSPITAL_COMMUNITY): Payer: Self-pay

## 2016-02-11 DIAGNOSIS — J111 Influenza due to unidentified influenza virus with other respiratory manifestations: Secondary | ICD-10-CM

## 2016-02-11 DIAGNOSIS — T887XXA Unspecified adverse effect of drug or medicament, initial encounter: Secondary | ICD-10-CM | POA: Diagnosis not present

## 2016-02-11 DIAGNOSIS — T50905A Adverse effect of unspecified drugs, medicaments and biological substances, initial encounter: Secondary | ICD-10-CM

## 2016-02-11 DIAGNOSIS — T375X5A Adverse effect of antiviral drugs, initial encounter: Secondary | ICD-10-CM | POA: Diagnosis not present

## 2016-02-11 LAB — POCT URINALYSIS DIP (DEVICE)
GLUCOSE, UA: NEGATIVE mg/dL
KETONES UR: NEGATIVE mg/dL
Leukocytes, UA: NEGATIVE
Nitrite: NEGATIVE
Protein, ur: 100 mg/dL — AB
Urobilinogen, UA: 0.2 mg/dL (ref 0.0–1.0)
pH: 5.5 (ref 5.0–8.0)

## 2016-02-11 NOTE — Discharge Instructions (Signed)
You are having a reaction to the Tamiflu. Please stop the Tamiflu. I have added this to your allergy list. Alternate Tylenol and ibuprofen every 4 hours for the fever and body aches. Get plenty of rest and drink plenty of fluids. The rash should improve in the next 2-3 days. You can take Benadryl as needed. Please follow-up here or with your primary doctor on Monday or Tuesday for recheck, sooner if things change.

## 2016-02-11 NOTE — ED Notes (Signed)
Patient presents with flu-like symptoms, she went to urgent care on 02/09/2016 and was diagnosed with flu and prescribed Tamiflu and she has noticed a rash breakout since Friday 02/10/2016. Patient is having pain in head and she complains of ears burning. No acute distress

## 2016-02-11 NOTE — ED Provider Notes (Signed)
CSN: [id number]     Arrival date & time 02/11/16  1744 History   First MD Initiated Contact with Patient 02/11/16 1755     Chief Complaint  Patient presents with  . Influenza   (Consider location/radiation/quality/duration/timing/severity/associated sxs/prior Treatment) HPI  She is a 45 year old woman here for follow-up of flu. She states she developed flu symptoms of body aches, cold chills, fever on Tuesday. She was seen at [facility] urgent care on Thursday and diagnosed with flu. She was started on Tamiflu. Yesterday, she developed a diffuse rash primarily on her arms and legs. The rash is not itchy. If she presses hard enough on it it mightbe a little sore.  She denies any lesions on her palms or soles. No oral lesions. She also reports a headache in the bilateral temples and jaw. She also reports a burning sensation to her ears. She has continued to have fever. She is febrile to 102.3 here. Last dose of Tamiflu was this morning around 5:30 AM.  No known tick bites.  Past Medical History  Diagnosis Date  . Anemia   . Fibrocystic breast   . TMJ arthralgia 2012  . Labial abscess 2011  . Inclusion cyst of vulva    Past Surgical History  Procedure Laterality Date  . Knee surgery  1990    left  . Wisdom tooth extraction  1991   Family History  Problem Relation Age of Onset  . Breast cancer Mother 1  . Cancer Mother   . Lung cancer Father   . Diabetes Father   . Cancer Father     lung  . Hypertension Father   . Kidney disease Paternal Grandmother   . Stroke Paternal Grandmother   . Diabetes Brother   . Stroke Maternal Grandmother    Social History  Substance Use Topics  . Smoking status: Never Smoker   . Smokeless tobacco: Never Used  . Alcohol Use: 1.8 oz/week    3 Standard drinks or equivalent per week     Comment: occasionally   OB History    Gravida Para Term Preterm AB TAB SAB Ectopic Multiple Living   0         0     Review of Systems As in history of present  illness Allergies  Shellfish allergy; Penicillins; and Tamiflu  Home Medications   Prior to Admission medications   Medication Sig Start Date End Date Taking? Authorizing Provider  Multiple Vitamin (MULTIVITAMIN) tablet Take 1 tablet by mouth daily. Reported on 02/09/2016    Historical Provider, MD   Meds Ordered and Administered this Visit  Medications - No data to display  BP 130/84 mmHg  Pulse 78  Temp(Src) 102.3 F (39.1 C) (Oral)  SpO2 95%  LMP 01/27/2016 (Exact Date) No data found.   Physical Exam  Constitutional: She is oriented to person, place, and time. She appears well-developed and well-nourished. No distress.  Appears somewhat ill  HENT:  Mouth/Throat: Oropharynx is clear and moist. No oropharyngeal exudate.  Eyes: Conjunctivae and EOM are normal. Pupils are equal, round, and reactive to light.  Neck: Neck supple.  Cardiovascular: Normal rate, regular rhythm and normal heart sounds.   No murmur heard. Pulmonary/Chest: Effort normal and breath sounds normal. No respiratory distress. She has no wheezes. She has no rales.  Lymphadenopathy:    She has no cervical adenopathy.  Neurological: She is alert and oriented to person, place, and time.  Skin: Rash (erythematous macular rash primarily on arms and legs. )  noted.    ED Course  Procedures (including critical care time)  Labs Review Labs Reviewed  POCT URINALYSIS DIP (DEVICE) - Abnormal; Notable for the following:    Bilirubin Urine SMALL (*)    Hgb urine dipstick MODERATE (*)    Protein, ur 100 (*)    All other components within normal limits    Imaging Review Dg Chest 2 View  02/11/2016  CLINICAL DATA:  Fever.  Rash. EXAM: CHEST  2 VIEW COMPARISON:  None. FINDINGS: Normal heart size. Normal mediastinal contour. No pneumothorax. No pleural effusion. Lungs appear clear, with no acute consolidative airspace disease and no pulmonary edema. IMPRESSION: No active cardiopulmonary disease. Electronically Signed    By: Ilona Sorrel M.D.   On: 02/11/2016 18:47     MDM   1. Influenza   2. Drug reaction, initial encounter    Rash is consistent with drug reaction. Recommended stopping Tamiflu. I have added this to her allergy list. Continue symptomatic treatment for flu symptoms. Okay to use Benadryl if needed for rash. She will follow-up with her PCP in 2-3 days for a recheck. If she is unable to get an appointment, she will come back here.    Melony Overly, MD 02/11/16 [phone]

## 2016-02-13 ENCOUNTER — Encounter (HOSPITAL_COMMUNITY): Payer: Self-pay | Admitting: *Deleted

## 2016-02-13 ENCOUNTER — Ambulatory Visit (HOSPITAL_COMMUNITY)
Admission: EM | Admit: 2016-02-13 | Discharge: 2016-02-13 | Disposition: A | Discharge status: Home / Self Care | Payer: BC Managed Care – PPO | Attending: Family Medicine | Admitting: Family Medicine

## 2016-02-13 DIAGNOSIS — Z09 Encounter for follow-up examination after completed treatment for conditions other than malignant neoplasm: Secondary | ICD-10-CM

## 2016-02-13 MED ORDER — PREDNISONE 10 MG PO TABS: 20.0000 mg | ORAL_TABLET | Freq: Every day | ORAL | Status: DC

## 2016-02-13 NOTE — ED Provider Notes (Signed)
CSN: [id number]     Arrival date & time 02/13/16  1617 History   First MD Initiated Contact with Patient 02/13/16 1639     Chief Complaint  Patient presents with  . Rash  . Follow-up   (Consider location/radiation/quality/duration/timing/severity/associated sxs/prior Treatment) HPI Pt is here for medical follow up. She was seen on Saturday for drug rash. States she is a bit better today, but continues with redness and itch. She denies any pain at this time. Has been using benadryl at home. States her fever broke yesterday. Out of work until Wednesday. Past Medical History  Diagnosis Date  . Anemia   . Fibrocystic breast   . TMJ arthralgia 2012  . Labial abscess 2011  . Inclusion cyst of vulva    Past Surgical History  Procedure Laterality Date  . Knee surgery  1990    left  . Wisdom tooth extraction  1991   Family History  Problem Relation Age of Onset  . Breast cancer Mother 26  . Cancer Mother   . Lung cancer Father   . Diabetes Father   . Cancer Father     lung  . Hypertension Father   . Kidney disease Paternal Grandmother   . Stroke Paternal Grandmother   . Diabetes Brother   . Stroke Maternal Grandmother    Social History  Substance Use Topics  . Smoking status: Never Smoker   . Smokeless tobacco: Never Used  . Alcohol Use: 1.8 oz/week    3 Standard drinks or equivalent per week     Comment: occasionally   OB History    Gravida Para Term Preterm AB TAB SAB Ectopic Multiple Living   0         0     Review of Systems Pt was seen this past weekend for possible medication reaction. Was treated with benadryl and released to home. Pt states taht she feels a bit better today, but continues to have itching of skin despite taking benadryl.   Allergies  Shellfish allergy; Penicillins; and Tamiflu  Home Medications   Prior to Admission medications   Medication Sig Start Date End Date Taking? Authorizing Provider  Multiple Vitamin (MULTIVITAMIN) tablet Take 1  tablet by mouth daily. Reported on 02/09/2016   Yes Historical Provider, MD  predniSONE (DELTASONE) 10 MG tablet Take 2 tablets (20 mg total) by mouth daily. 02/13/16   Konrad Felix, PA   Meds Ordered and Administered this Visit  Medications - No data to display  BP 127/78 mmHg  Pulse 74  Temp(Src) 97.4 F (36.3 C) (Oral)  Resp 17  SpO2 100%  LMP 01/27/2016 (Exact Date) No data found.   Physical Exam NURSES NOTES AND VITAL SIGNS REVIEWED. CONSTITUTIONAL: Well developed, well nourished, no acute distress HEENT: normocephalic, atraumatic EYES: Conjunctiva normal NECK:normal ROM, supple, no adenopathy PULMONARY:No respiratory distress, normal effort MUSCULOSKELETAL: Normal ROM of all extremities,  SKIN: warm and dry there is some redness to the skin. Some itching.  No signs of sin infection PSYCHIATRIC: Mood and affect, behavior are normal  ED Course  Procedures (including critical care time)  Labs Review Labs Reviewed - No data to display  Imaging Review Dg Chest 2 View  02/11/2016  CLINICAL DATA:  Fever.  Rash. EXAM: CHEST  2 VIEW COMPARISON:  None. FINDINGS: Normal heart size. Normal mediastinal contour. No pneumothorax. No pleural effusion. Lungs appear clear, with no acute consolidative airspace disease and no pulmonary edema. IMPRESSION: No active cardiopulmonary disease. Electronically Signed  By: Ilona Sorrel M.D.   On: 02/11/2016 18:47     Visual Acuity Review  Right Eye Distance:   Left Eye Distance:   Bilateral Distance:    Right Eye Near:   Left Eye Near:    Bilateral Near:     RX Prednisone   MDM   1. Follow-up examination     Patient is reassured that there are no issues that require transfer to higher level of care at this time or additional tests. Patient is advised to continue home symptomatic treatment. Patient is advised that if there are new or worsening symptoms to attend the emergency department, contact primary care provider, or return  to UC. Instructions of care provided discharged home in stable condition.    THIS NOTE WAS GENERATED USING A VOICE RECOGNITION SOFTWARE PROGRAM. ALL REASONABLE EFFORTS  WERE MADE TO PROOFREAD THIS DOCUMENT FOR ACCURACY.  I have verbally reviewed the discharge instructions with the patient. A printed AVS was given to the patient.  All questions were answered prior to discharge.      Konrad Felix, PA 02/13/16 [place], [place] 02/13/16 [phone]

## 2016-02-13 NOTE — ED Notes (Signed)
Patient here for follow up after being diagnosed with allergy to tamiflu. Patient was diagnosed with flu on Thursday and started on tamiflu, came to [facility] on Saturday for small macular rash to entire body. Patient took ibuprofen, tylenol and benadryl last night. Patient states she is feeling better then she was on Saturday.

## 2019-11-25 ENCOUNTER — Ambulatory Visit: Payer: BC Managed Care – PPO | Attending: Family Medicine | Admitting: Family Medicine

## 2019-11-25 ENCOUNTER — Encounter: Payer: Self-pay | Admitting: Family Medicine

## 2019-11-25 ENCOUNTER — Other Ambulatory Visit: Payer: Self-pay

## 2019-11-25 DIAGNOSIS — R21 Rash and other nonspecific skin eruption: Secondary | ICD-10-CM | POA: Diagnosis not present

## 2019-11-25 DIAGNOSIS — J328 Other chronic sinusitis: Secondary | ICD-10-CM | POA: Diagnosis not present

## 2019-11-25 NOTE — Progress Notes (Signed)
Virtual Visit via Telephone Note  I connected with [name], on 11/25/2019 at 1:39 PM by telephone due to the COVID-19 pandemic and verified that I am speaking with the correct person using two identifiers.   Consent: I discussed the limitations, risks, security and privacy concerns of performing an evaluation and management service by telephone and the availability of in person appointments. I also discussed with the patient that there may be a patient responsible charge related to this service. The patient expressed understanding and agreed to proceed.   Location of Patient: Home  Location of Provider: Clinic   Persons participating in Telemedicine visit: Lonni Thrasher Farrington-CMA Dr. Margarita Rana     History of Present Illness: Allison Farrell is a 49 year old female with chronic sinusitis who presents today to establish care.  Prior to the pandemic she was followed by Dr Ayesha Rumpf  -her PCP at that time. She has been having blotches on the dorsum of her hands which she had last winter and lesions do not itch. She applies dollar tree lotion and at other times uses Dove or Aquaphor.  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 She would like to have a physical;  through [place] just had a mammogram and PAP smear today She does vitamin C, multivitamin, eldeberries but does not take chronic medications; uses OTC Zyrtec for her chronic sinusitis.  Past Medical History:  Diagnosis Date  . Anemia   . Fibrocystic breast   . Inclusion cyst of vulva   . Labial abscess 2011  . TMJ arthralgia 2012   Allergies  Allergen Reactions  . Shellfish Allergy Hives and Swelling  . Penicillins     Does not remember possibly when child  . Tamiflu [Oseltamivir Phosphate] Rash    Current Outpatient Medications on File Prior to Visit  Medication Sig Dispense  Refill  . Multiple Vitamin (MULTIVITAMIN) tablet Take 1 tablet by mouth daily. Reported on 02/09/2016    . predniSONE (DELTASONE) 10 MG tablet Take 2 tablets (20 mg total) by mouth daily. (Patient not taking: Reported on 11/25/2019) 15 tablet 0   No current facility-administered medications on file prior to visit.    Observations/Objective: Alert, awake, oriented x3 Not in acute distress  Assessment and Plan: 1. Other chronic sinusitis Doing well on Zyrtec We have discussed the use of nasal irrigation  2. Rash Advised as this could be secondary to xerosis from frequent handwashing and irritation from hand sanitizers Use moisturizers including Aquaphor, Dove Will reassess at next visit for the need for topical steroid   Follow Up Instructions: Return in about 3 weeks (around 12/16/2019) for Follow-up of rash and labs.    I discussed the assessment and treatment plan with the patient. The patient was provided an opportunity to ask questions and all were answered. The patient agreed with the plan and demonstrated an understanding of the instructions.   The patient was advised to call back or seek an in-person evaluation if the symptoms worsen or if the condition fails to improve as anticipated.     I provided 16 minutes total of non-face-to-face time during this encounter including median intraservice time, reviewing previous notes, investigations, ordering medications, medical decision making, coordinating care and patient verbalized understanding at the end of the visit.     Charlott Rakes, MD, FAAFP. [place] and [place], [place]  [phone]   11/25/2019, 1:39 PM

## 2019-11-25 NOTE — Progress Notes (Signed)
Patient has been called and DOB has been verified. Patient has been screened and transferred to PCP to start phone visit.     

## 2019-11-26 ENCOUNTER — Encounter: Payer: Self-pay | Admitting: Family Medicine

## 2019-12-16 ENCOUNTER — Ambulatory Visit: Payer: BC Managed Care – PPO | Attending: Family Medicine | Admitting: Family Medicine

## 2019-12-16 ENCOUNTER — Other Ambulatory Visit: Payer: Self-pay

## 2019-12-16 ENCOUNTER — Encounter: Payer: Self-pay | Admitting: Family Medicine

## 2019-12-16 VITALS — BP 126/80 | HR 62 | Temp 98.1°F | Ht 63.0 in | Wt 189.0 lb

## 2019-12-16 DIAGNOSIS — J328 Other chronic sinusitis: Secondary | ICD-10-CM | POA: Diagnosis not present

## 2019-12-16 DIAGNOSIS — J329 Chronic sinusitis, unspecified: Secondary | ICD-10-CM | POA: Insufficient documentation

## 2019-12-16 DIAGNOSIS — E669 Obesity, unspecified: Secondary | ICD-10-CM

## 2019-12-16 DIAGNOSIS — Z0000 Encounter for general adult medical examination without abnormal findings: Secondary | ICD-10-CM | POA: Diagnosis not present

## 2019-12-16 DIAGNOSIS — Z131 Encounter for screening for diabetes mellitus: Secondary | ICD-10-CM | POA: Diagnosis not present

## 2019-12-16 DIAGNOSIS — E66811 Obesity, class 1: Secondary | ICD-10-CM

## 2019-12-16 DIAGNOSIS — Z6833 Body mass index (BMI) 33.0-33.9, adult: Secondary | ICD-10-CM

## 2019-12-16 LAB — POCT GLYCOSYLATED HEMOGLOBIN (HGB A1C): HbA1c, POC (controlled diabetic range): 5.4 % (ref 0.0–7.0)

## 2019-12-16 NOTE — Patient Instructions (Signed)

## 2019-12-16 NOTE — Progress Notes (Signed)
Established Patient Office Visit  Subjective:  Patient ID: Allison Farrell, female    DOB: April 06, 1971  Age: 49 y.o. MRN: [id number]  CC:  Chief Complaint  Patient presents with  . New Patient (Initial Visit)    HPI Allison Farrell is a 49 year old patient with a history of chronic sinusitis and obesity who presents today for an annual physical.  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  Last pap smear and mammogram on 11/25/2019.  Her chronic sinusitis is controlled on OTC Zyrtec.  The rash she complained of at her last visit has significantly improved with regular use Aquaphor.   Denies family history of colon cancer. Counseled on healthy diet, my plate method and 631 minutes of moderate intensity exercise/week.   She has no complaints today but she would like to have lab work completed.  Past Medical History:  Diagnosis Date  . Anemia   . Fibrocystic breast   . Inclusion cyst of vulva   . Labial abscess 2011  . TMJ arthralgia 2012    Past Surgical History:  Procedure Laterality Date  . [place]   left  . WISDOM TOOTH EXTRACTION  1991    Family History  Problem Relation Age of Onset  . Breast cancer Mother 54  . Cancer Mother   . Lung cancer Father   . Diabetes Father   . Cancer Father        lung  . Hypertension Father   . Kidney disease Paternal Grandmother   . Stroke Paternal Grandmother   . Diabetes Brother   . Stroke Maternal Grandmother     Social History   Socioeconomic History  . Marital status: Single    Spouse name: Not on file  . Number of children: Not on file  . Years of education: Not on file  . Highest education level: Not on file  Occupational History  . Not on file  Tobacco Use  . Smoking status: Never Smoker  . Smokeless tobacco: Never Used  Substance and Sexual Activity  . Alcohol use: Yes    Alcohol/week: 3.0 standard drinks    Types: 3 Standard drinks or equivalent per week     Comment: occasionally  . Drug use: No  . Sexual activity: Not Currently    Birth control/protection: Abstinence  Other Topics Concern  . Not on file  Social History Narrative  . Not on file   Social Determinants of Health   Financial Resource Strain:   . Difficulty of Paying Living Expenses: Not on file  Food Insecurity:   . Worried About Charity fundraiser in the Last Year: Not on file  . Ran Out of Food in the Last Year: Not on file  Transportation Needs:   . Lack of Transportation (Medical): Not on file  . Lack of Transportation (Non-Medical): Not on file  Physical Activity:   . Days of Exercise per Week: Not on file  . Minutes of Exercise per Session: Not on file  Stress:   . Feeling of Stress : Not on file  Social Connections:   . Frequency of Communication with Friends and Family: Not on file  . Frequency of Social Gatherings with Friends and Family: Not on file  . Attends Religious Services: Not on file  . Active Member of Clubs or Organizations: Not on file  . Attends Archivist Meetings: Not on file  . Marital Status: Not  on file  Intimate Partner Violence:   . Fear of Current or Ex-Partner: Not on file  . Emotionally Abused: Not on file  . Physically Abused: Not on file  . Sexually Abused: Not on file    Outpatient Medications Prior to Visit  Medication Sig Dispense Refill  . Multiple Vitamin (MULTIVITAMIN) tablet Take 1 tablet by mouth daily. Reported on 02/09/2016    . predniSONE (DELTASONE) 10 MG tablet Take 2 tablets (20 mg total) by mouth daily. (Patient not taking: Reported on 11/25/2019) 15 tablet 0   No facility-administered medications prior to visit.    Allergies  Allergen Reactions  . Shellfish Allergy Hives and Swelling  . Penicillins     Does not remember possibly when child  . Tamiflu [Oseltamivir Phosphate] Rash    ROS Review of Systems  Constitutional: Negative for fatigue, fever and unexpected weight change.  HENT: Negative  for congestion, rhinorrhea, sinus pressure and sinus pain.   Eyes: Negative for visual disturbance.  Respiratory: Negative for cough, chest tightness and shortness of breath.   Cardiovascular: Negative for chest pain, palpitations and leg swelling.  Gastrointestinal: Negative for abdominal distention, abdominal pain, constipation, diarrhea, nausea and vomiting.  Endocrine: Negative for polydipsia and polyuria.  Genitourinary: Negative for decreased urine volume, difficulty urinating and dysuria.  Musculoskeletal: Negative for arthralgias and myalgias.  Skin: Negative for color change and rash.  Neurological: Negative for dizziness, tremors, weakness and numbness.  Hematological: Does not bruise/bleed easily.  Psychiatric/Behavioral: Negative for agitation and behavioral problems.      Objective:    Physical Exam  Constitutional: She is oriented to person, place, and time. She appears well-developed and well-nourished. No distress.  HENT:  Head: Normocephalic and atraumatic.  Eyes: Pupils are equal, round, and reactive to light. Conjunctivae and EOM are normal.  Cardiovascular: Normal rate, regular rhythm, normal heart sounds and intact distal pulses.  No murmur heard. Pulmonary/Chest: Effort normal and breath sounds normal. No respiratory distress.  Abdominal: Soft. Bowel sounds are normal. She exhibits no distension. There is no abdominal tenderness.  Musculoskeletal:        General: No edema. Normal range of motion.  Neurological: She is alert and oriented to person, place, and time.  Skin: Skin is warm and dry. No rash noted.  Psychiatric: She has a normal mood and affect. Her behavior is normal.  Nursing note and vitals reviewed.   BP 126/80   Pulse 62   Temp 98.1 F (36.7 C) (Temporal)   Ht 5' 3" (1.6 m)   Wt 189 lb (85.7 kg)   SpO2 100%   BMI 33.48 kg/m  Wt Readings from Last 3 Encounters:  12/16/19 189 lb (85.7 kg)  02/09/16 180 lb 12.8 oz (82 kg)  10/31/15 176 lb  (79.8 kg)     Health Maintenance Due  Topic Date Due  . HIV Screening  07/21/1988  . TETANUS/TDAP  07/21/1992    There are no preventive care reminders to display for this patient.  No results found for: TSH Lab Results  Component Value Date   WBC 4.6 02/09/2016   HGB 13.2 02/09/2016   HCT 39.8 02/09/2016   MCV 71.8 (A) 02/09/2016   PLT 340 10/31/2015   Lab Results  Component Value Date   NA 137 10/31/2015   K 4.4 10/31/2015   CO2 28 10/31/2015   GLUCOSE 88 10/31/2015   BUN 11 10/31/2015   CREATININE 0.73 10/31/2015   BILITOT 0.4 10/31/2015   ALKPHOS 50 10/31/2015  AST 15 10/31/2015   ALT 16 10/31/2015   PROT 7.1 10/31/2015   ALBUMIN 3.7 10/31/2015   CALCIUM 9.2 10/31/2015   No results found for: CHOL No results found for: HDL No results found for: LDLCALC No results found for: TRIG No results found for: [facility] Lab Results  Component Value Date   HGBA1C 5.4 12/16/2019      Assessment & Plan:   1. Annual physical exam Pap smear and mammogram completed 11/25/19 by GYN. Educated on healthy lifestyle choices and dietary choices. Encouraged 150 minutes of moderate intensity exercise per week. - CMP14+EGFR - Lipid panel - CBC with Differential/Platelet - HIV Antibody (routine testing w rflx) - T4, free - TSH  2. Screening for diabetes mellitus A1c of 5.4 See #3 - POCT glycosylated hemoglobin (Hb A1C)  3. Obesity (BMI 30.0-34.9) Educated on healthy lifestyle choices and dietary choices. Encouraged 150 minutes of moderate intensity exercise per week.  4. Other chronic sinusitis Stable  Continue OTC Zyrtec   No orders of the defined types were placed in this encounter.   Follow-up: Return in about 1 year (around 12/15/2020) for annual physical exam.    Tomasita Morrow, RN   Evaluation and management procedures were performed by me with DNP Student in attendance, note written by DNP student under my supervision and collaboration. I have  reviewed the note and I agree with the management and plan.  I had seen Ms. 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  Use of OTC Aquaphor has been beneficial and rash has resolved. She is up-to-date on her mammogram and Pap smear which she had with GYN at [facility] and reports a good result.  She is low risk for colon cancer hence we will wait until she is 52 to refer for Colonoscopy. We will proceed with ordering fasting labs today.  Charlott Rakes, MD, FAAFP. [facility] and [facility] [place], [place]   12/16/2019, 1:13 PM

## 2019-12-17 LAB — CBC WITH DIFFERENTIAL/PLATELET
Basophils Absolute: 0.1 10*3/uL (ref 0.0–0.2)
Basos: 1 %
EOS (ABSOLUTE): 0.1 10*3/uL (ref 0.0–0.4)
Eos: 1 %
Hematocrit: 39.3 % (ref 34.0–46.6)
Hemoglobin: 12.3 g/dL (ref 11.1–15.9)
Immature Grans (Abs): 0 10*3/uL (ref 0.0–0.1)
Immature Granulocytes: 0 %
Lymphocytes Absolute: 1.8 10*3/uL (ref 0.7–3.1)
Lymphs: 51 %
MCH: 23.2 pg — ABNORMAL LOW (ref 26.6–33.0)
MCHC: 31.3 g/dL — ABNORMAL LOW (ref 31.5–35.7)
MCV: 74 fL — ABNORMAL LOW (ref 79–97)
Monocytes Absolute: 0.3 10*3/uL (ref 0.1–0.9)
Monocytes: 8 %
Neutrophils Absolute: 1.4 10*3/uL (ref 1.4–7.0)
Neutrophils: 39 %
Platelets: 323 10*3/uL (ref 150–450)
RBC: 5.3 x10E6/uL — ABNORMAL HIGH (ref 3.77–5.28)
RDW: 13.3 % (ref 11.7–15.4)
WBC: 3.6 10*3/uL (ref 3.4–10.8)

## 2019-12-17 LAB — CMP14+EGFR
ALT: 11 IU/L (ref 0–32)
AST: 21 IU/L (ref 0–40)
Albumin/Globulin Ratio: 1.3 (ref 1.2–2.2)
Albumin: 4 g/dL (ref 3.8–4.8)
Alkaline Phosphatase: 60 IU/L (ref 39–117)
BUN/Creatinine Ratio: 20 (ref 9–23)
BUN: 14 mg/dL (ref 6–24)
Bilirubin Total: <0.2 mg/dL (ref 0.0–1.2)
CO2: 21 mmol/L (ref 20–29)
Calcium: 9.2 mg/dL (ref 8.7–10.2)
Chloride: 105 mmol/L (ref 96–106)
Creatinine, Ser: 0.69 mg/dL (ref 0.57–1.00)
GFR calc Af Amer: 121 mL/min/{1.73_m2} (ref >59)
GFR calc non Af Amer: 105 mL/min/{1.73_m2} (ref >59)
Globulin, Total: 3.2 g/dL (ref 1.5–4.5)
Glucose: 91 mg/dL (ref 65–99)
Potassium: 4.7 mmol/L (ref 3.5–5.2)
Sodium: 139 mmol/L (ref 134–144)
Total Protein: 7.2 g/dL (ref 6.0–8.5)

## 2019-12-17 LAB — T4, FREE: Free T4: 1.09 ng/dL (ref 0.82–1.77)

## 2019-12-17 LAB — LIPID PANEL
Chol/HDL Ratio: 2.9 ratio (ref 0.0–4.4)
Cholesterol, Total: 174 mg/dL (ref 100–199)
HDL: 59 mg/dL (ref >39)
LDL Chol Calc (NIH): 106 mg/dL — ABNORMAL HIGH (ref 0–99)
Triglycerides: 44 mg/dL (ref 0–149)
VLDL Cholesterol Cal: 9 mg/dL (ref 5–40)

## 2019-12-17 LAB — TSH: TSH: 0.903 u[IU]/mL (ref 0.450–4.500)

## 2019-12-17 LAB — HIV ANTIBODY (ROUTINE TESTING W REFLEX): HIV Screen 4th Generation wRfx: NONREACTIVE

## 2020-02-25 ENCOUNTER — Other Ambulatory Visit: Payer: Self-pay | Admitting: Family Medicine

## 2020-02-25 ENCOUNTER — Encounter: Payer: Self-pay | Admitting: Family Medicine

## 2020-02-25 DIAGNOSIS — Z1211 Encounter for screening for malignant neoplasm of colon: Secondary | ICD-10-CM

## 2020-03-02 ENCOUNTER — Encounter: Payer: Self-pay | Admitting: Gastroenterology

## 2020-03-31 ENCOUNTER — Ambulatory Visit (AMBULATORY_SURGERY_CENTER): Payer: Self-pay | Admitting: *Deleted

## 2020-03-31 ENCOUNTER — Other Ambulatory Visit: Payer: Self-pay

## 2020-03-31 VITALS — Ht 63.0 in | Wt 184.0 lb

## 2020-03-31 DIAGNOSIS — Z1211 Encounter for screening for malignant neoplasm of colon: Secondary | ICD-10-CM

## 2020-03-31 NOTE — Progress Notes (Signed)
During PV pt states she is having increased abd pain, buroing a lot, lots of buroing in a row, nausea with burping, early satiety, ? GERD, not emptying her bowels well-  No Gi hx- made her an OV to see Darrell Jewel 04-27-2020 at 220 pm to address these issues and instructed pt will get her RS after OV    No egg or soy allergy known to patient  No issues with past sedation with any surgeries  or procedures, no intubation problems  No diet pills per patient No home 02 use per patient  No blood thinners per patient  Pt denies issues with constipation  No A fib or A flutter  EMMI video sent to pt's e mail   Due to the COVID-19 pandemic we are asking patients to follow these guidelines. Please only bring one care partner. Please be aware that your care partner may wait in the car in the parking lot or if they feel like they will be too hot to wait in the car, they may wait in the lobby on the 4th floor. All care partners are required to wear a mask the entire time (we do not have any that we can provide them), they need to practice social distancing, and we will do a Covid check for all patient's and care partners when you arrive. Also we will check their temperature and your temperature. If the care partner waits in their car they need to stay in the parking lot the entire time and we will call them on their cell phone when the patient is ready for discharge so they can bring the car to the front of the building. Also all patient's will need to wear a mask into building.

## 2020-04-14 ENCOUNTER — Encounter: Payer: BC Managed Care – PPO | Admitting: Gastroenterology

## 2020-04-27 ENCOUNTER — Ambulatory Visit: Payer: BC Managed Care – PPO | Admitting: Physician Assistant

## 2020-05-10 ENCOUNTER — Ambulatory Visit: Payer: BC Managed Care – PPO | Admitting: Nurse Practitioner

## 2020-05-10 ENCOUNTER — Other Ambulatory Visit (INDEPENDENT_AMBULATORY_CARE_PROVIDER_SITE_OTHER): Payer: BC Managed Care – PPO

## 2020-05-10 ENCOUNTER — Encounter: Payer: Self-pay | Admitting: Nurse Practitioner

## 2020-05-10 VITALS — BP 120/80 | HR 64 | Ht 63.0 in | Wt 193.0 lb

## 2020-05-10 DIAGNOSIS — K5900 Constipation, unspecified: Secondary | ICD-10-CM

## 2020-05-10 DIAGNOSIS — Z1211 Encounter for screening for malignant neoplasm of colon: Secondary | ICD-10-CM

## 2020-05-10 DIAGNOSIS — R112 Nausea with vomiting, unspecified: Secondary | ICD-10-CM | POA: Diagnosis not present

## 2020-05-10 DIAGNOSIS — R1013 Epigastric pain: Secondary | ICD-10-CM

## 2020-05-10 LAB — CBC WITH DIFFERENTIAL/PLATELET
Basophils Absolute: 0 10*3/uL (ref 0.0–0.1)
Basophils Relative: 0.7 % (ref 0.0–3.0)
Eosinophils Absolute: 0.1 10*3/uL (ref 0.0–0.7)
Eosinophils Relative: 1.3 % (ref 0.0–5.0)
HCT: 38.4 % (ref 36.0–46.0)
Hemoglobin: 12.2 g/dL (ref 12.0–15.0)
Lymphocytes Relative: 43.3 % (ref 12.0–46.0)
Lymphs Abs: 2 10*3/uL (ref 0.7–4.0)
MCHC: 31.9 g/dL (ref 30.0–36.0)
MCV: 73.6 fl — ABNORMAL LOW (ref 78.0–100.0)
Monocytes Absolute: 0.4 10*3/uL (ref 0.1–1.0)
Monocytes Relative: 8 % (ref 3.0–12.0)
Neutro Abs: 2.2 10*3/uL (ref 1.4–7.7)
Neutrophils Relative %: 46.7 % (ref 43.0–77.0)
Platelets: 299 10*3/uL (ref 150.0–400.0)
RBC: 5.21 Mil/uL — ABNORMAL HIGH (ref 3.87–5.11)
RDW: 13.8 % (ref 11.5–15.5)
WBC: 4.7 10*3/uL (ref 4.0–10.5)

## 2020-05-10 LAB — COMPREHENSIVE METABOLIC PANEL
ALT: 18 U/L (ref 0–35)
AST: 16 U/L (ref 0–37)
Albumin: 4 g/dL (ref 3.5–5.2)
Alkaline Phosphatase: 55 U/L (ref 39–117)
BUN: 14 mg/dL (ref 6–23)
CO2: 29 mEq/L (ref 19–32)
Calcium: 9.5 mg/dL (ref 8.4–10.5)
Chloride: 103 mEq/L (ref 96–112)
Creatinine, Ser: 0.76 mg/dL (ref 0.40–1.20)
GFR: 98.79 mL/min (ref >60.00)
Glucose, Bld: 91 mg/dL (ref 70–99)
Potassium: 4.4 mEq/L (ref 3.5–5.1)
Sodium: 137 mEq/L (ref 135–145)
Total Bilirubin: 0.3 mg/dL (ref 0.2–1.2)
Total Protein: 7.3 g/dL (ref 6.0–8.3)

## 2020-05-10 LAB — LIPASE: Lipase: 36 U/L (ref 11.0–59.0)

## 2020-05-10 MED ORDER — CLENPIQ 10-3.5-12 MG-GM -GM/160ML PO SOLN: ORAL | 0 refills | Status: DC

## 2020-05-10 NOTE — Patient Instructions (Addendum)
If you are age 47 or older, your body mass index should be between 23-30. Your Body mass index is 34.19 kg/m. If this is out of the aforementioned range listed, please consider follow up with your Primary Care Provider.  If you are age 23 or younger, your body mass index should be between 19-25. Your Body mass index is 34.19 kg/m. If this is out of the aformentioned range listed, please consider follow up with your Primary Care Provider.   You have been scheduled for an abdominal ultrasound at [facility]iology (1st floor of hospital) on at 05/13/2020 at 11:00 am Please arrive 15 minutes prior to your appointment for registration. Make certain not to have anything to eat or drink 6 hours prior to your appointment. Should you need to reschedule your appointment, please contact radiology at [phone]. This test typically takes about 30 minutes to perform.  Your provider has requested that you go to the basement level for lab work before leaving today. Press "B" on the elevator. The lab is located at the first door on the left as you exit the elevator.  We have sent the following medications to your pharmacy for you to pick up at your convenience:  clenpiq  Take Miralax 1 capful mixed in 8 ounces of water at bed time for constipation as tolerated.   Due to recent changes in healthcare laws, you may see the results of your imaging and laboratory studies on MyChart before your provider has had a chance to review them.  We understand that in some cases there may be results that are confusing or concerning to you. Not all laboratory results come back in the same time frame and the provider may be waiting for multiple results in order to interpret others.  Please give [place] 48 hours in order for your provider to thoroughly review all the results before contacting the office for clarification of your results.

## 2020-05-10 NOTE — Progress Notes (Signed)
05/10/2020 Allison Farrell [id number] 21-Jan-1971   CHIEF COMPLAINT: Nausea, schedule a screening colonoscopy  HISTORY OF PRESENT ILLNESS: Allison Farrell with past medical history of sinusitis, TMJ and remote anemia.  Past knee surgery in 1990 and wisdom tooth extraction 1991.  She presents to our office today to schedule a screening colonoscopy as referred by her PCP Dr. Margarita Rana. She also reports having nausea  which started 02/20/2020.  She drank ginger tea and her nausea subsided but recurred the next day.  She continued to have moderate nausea daily for the next 4 to 6 weeks.  She felt as if food sat in the epigastric area for prolonged periods of time.   No vomiting.  No significant upper or lower abdominal pain.  She noticed a change in her bowel pattern during this time as well.  She reported passing small pebble-like stools every 2 to 4 days.  She previously passed a normal brown bowel movement most days.  She increased her fiber intake and her constipation has improved.  Her nausea has significantly improved but has not completely abated.  She has excessive burping at times.  No fever, sweats or chills.  No weight loss.  She infrequently takes Aleve 1 tab as needed for headaches.  No other NSAID use.  She last took antibiotics in March 20 21 due to having of vaginal abscess.  She wishes to schedule an EGD and a screening colonoscopy.  No family history of esophageal, gastric or colorectal cancer.  She has gained 20 pounds in the past year.  No other complaints today.  CBC Latest Ref Rng & Units 12/16/2019 02/09/2016 10/31/2015  WBC 3.4 - 10.8 x10E3/uL 3.6 4.6 4.5  Hemoglobin 11.1 - 15.9 g/dL 12.3 13.2 13.1  Hematocrit 34.0 - 46.6 % 39.3 39.8 42.6  Platelets 150 - 450 x10E3/uL 323 - 340   CMP Latest Ref Rng & Units 12/16/2019 10/31/2015 07/13/2011  Glucose 65 - 99 mg/dL 91 88 97  BUN 6 - 24 mg/dL 14 11 9   Creatinine 0.57 - 1.00 mg/dL 0.69 0.73 0.80  Sodium 134 - 144 mmol/L 139 137 139  Potassium  3.5 - 5.2 mmol/L 4.7 4.4 3.9  Chloride 96 - 106 mmol/L 105 102 104  CO2 20 - 29 mmol/L 21 28 -  Calcium 8.7 - 10.2 mg/dL 9.2 9.2 -  Total Protein 6.0 - 8.5 g/dL 7.2 7.1 -  Total Bilirubin 0.0 - 1.2 mg/dL <0.2 0.4 -  Alkaline Phos 39 - 117 IU/L 60 50 -  AST 0 - 40 IU/L 21 15 -  ALT 0 - 32 IU/L 11 16 -    Social History: She is single.  She is a high Research scientist (physical sciences).  Nonsmoker. She drinks one mixed drink 3 days weekly. No drug use.   Family History: Mother died age 54's died from breast cancer. Father with history of DM died age 62 from lung cancer. Brother with history of bladder cancer.    Allergies  Allergen Reactions  . Shellfish Allergy Hives and Swelling  . Fish Allergy   . Penicillins     Does not remember possibly when child  . Tamiflu [Oseltamivir Phosphate] Rash      Outpatient Encounter Medications as of 05/10/2020  Medication Sig  . Ascorbic Acid (VITAMIN C) 1000 MG tablet Vitamin C  . ELDERBERRY PO Elderberry  . Multiple Vitamin (MULTIVITAMIN) tablet Take 1 tablet by mouth daily. Reported on 02/09/2016  . [DISCONTINUED] predniSONE (DELTASONE) 10 MG tablet  Take 2 tablets (20 mg total) by mouth daily. (Patient not taking: Reported on 11/25/2019)   No facility-administered encounter medications on file as of 05/10/2020.     REVIEW OF SYSTEMS:  Gen: Denies fever, sweats or chills. No weight loss.  CV: Denies chest pain, palpitations or edema. Resp: Denies cough, shortness of breath of hemoptysis.  GI: See HPI. GU : Denies urinary burning, blood in urine, increased urinary frequency or incontinence. MS: Denies joint pain, muscles aches or weakness. Derm: Denies rash, itchiness, skin lesions or unhealing ulcers. Psych: Denies depression, anxiety, memory loss or confusion. Heme: Denies bruising, bleeding. Neuro:  Denies headaches, dizziness or paresthesias. Endo:  Denies any problems with DM, thyroid or adrenal function.    PHYSICAL EXAM: BP 120/80   Pulse  64   Ht 5\' 3"  (1.6 m)   Wt 193 lb (87.5 kg)   LMP 05/01/2020 (Approximate)   BMI 34.19 kg/m  General: Well developed 49 year old female in no acute distress. Head: Normocephalic and atraumatic. Eyes:  Sclerae non-icteric, conjunctive pink. Ears: Normal auditory acuity. Mouth: Dentition intact. No ulcers or lesions.  Neck: Supple, no lymphadenopathy or thyromegaly.  Lungs: Clear bilaterally to auscultation without wheezes, crackles or rhonchi. Heart: Regular rate and rhythm. No murmur, rub or gallop appreciated.  Abdomen: Soft, nontender, non distended. No masses. No hepatosplenomegaly. Normoactive bowel sounds x 4 quadrants.  Rectal: Deferred.  Musculoskeletal: Symmetrical with no gross deformities. Skin: Warm and dry. No rash or lesions on visible extremities. Extremities: No edema. Neurological: Alert oriented x 4, no focal deficits.  Psychological:  Alert and cooperative. Normal mood and affect.  ASSESSMENT AND PLAN:  78.  49 year old female with nausea.  Increased burping. She describes food sits in the epigastric area for a prolonged period of time.  -She declines anti-nausea medication -CBC, CMET, lipase  -Abdominal sonogram to evaluate the gallbladder -EGD benefits and risks discussed including risk with sedation, risk of bleeding, perforation and infection  -Consider gastric emptying study if EGD and sonogram negative -Patient to call our office if her symptoms worsen  2.  Constipation -MiraLAX as needed -Increase water intake  8.  Screening colonoscopy -Colonoscopy benefits and risks discussed including risk with sedation, risk of bleeding, perforation and infection              CC:  Charlott Rakes, MD

## 2020-05-10 NOTE — Progress Notes (Signed)
Reviewed and agree with management plan.  Shai Mckenzie T. Cambree Hendrix, MD FACG [facility]  

## 2020-05-13 ENCOUNTER — Ambulatory Visit (HOSPITAL_COMMUNITY): Payer: BC Managed Care – PPO

## 2020-05-16 ENCOUNTER — Other Ambulatory Visit: Payer: Self-pay

## 2020-05-16 ENCOUNTER — Ambulatory Visit (HOSPITAL_COMMUNITY)
Admission: RE | Admit: 2020-05-16 | Discharge: 2020-05-16 | Disposition: A | Discharge status: Home / Self Care | Payer: BC Managed Care – PPO | Source: Ambulatory Visit | Attending: Nurse Practitioner | Admitting: Nurse Practitioner

## 2020-05-16 DIAGNOSIS — K5900 Constipation, unspecified: Secondary | ICD-10-CM

## 2020-05-16 DIAGNOSIS — R112 Nausea with vomiting, unspecified: Secondary | ICD-10-CM | POA: Insufficient documentation

## 2020-05-16 DIAGNOSIS — R1013 Epigastric pain: Secondary | ICD-10-CM | POA: Diagnosis present

## 2020-05-16 DIAGNOSIS — Z1211 Encounter for screening for malignant neoplasm of colon: Secondary | ICD-10-CM

## 2020-05-24 ENCOUNTER — Ambulatory Visit: Payer: BC Managed Care – PPO | Admitting: Family Medicine

## 2020-06-17 ENCOUNTER — Encounter: Payer: Self-pay | Admitting: Gastroenterology

## 2020-06-24 ENCOUNTER — Ambulatory Visit (INDEPENDENT_AMBULATORY_CARE_PROVIDER_SITE_OTHER): Payer: BC Managed Care – PPO

## 2020-06-24 ENCOUNTER — Other Ambulatory Visit: Payer: Self-pay

## 2020-06-24 ENCOUNTER — Other Ambulatory Visit: Payer: Self-pay | Admitting: Gastroenterology

## 2020-06-24 DIAGNOSIS — Z1159 Encounter for screening for other viral diseases: Secondary | ICD-10-CM

## 2020-06-25 LAB — SARS CORONAVIRUS 2 (TAT 6-24 HRS): SARS Coronavirus 2: NEGATIVE

## 2020-06-29 ENCOUNTER — Other Ambulatory Visit: Payer: Self-pay

## 2020-06-29 ENCOUNTER — Encounter: Payer: Self-pay | Admitting: Gastroenterology

## 2020-06-29 ENCOUNTER — Ambulatory Visit (AMBULATORY_SURGERY_CENTER): Payer: BC Managed Care – PPO | Admitting: Gastroenterology

## 2020-06-29 VITALS — BP 143/77 | HR 51 | Temp 97.8°F | Resp 14 | Ht 63.0 in | Wt 193.0 lb

## 2020-06-29 DIAGNOSIS — Z1211 Encounter for screening for malignant neoplasm of colon: Secondary | ICD-10-CM | POA: Diagnosis present

## 2020-06-29 DIAGNOSIS — R110 Nausea: Secondary | ICD-10-CM

## 2020-06-29 DIAGNOSIS — R1013 Epigastric pain: Secondary | ICD-10-CM

## 2020-06-29 DIAGNOSIS — D125 Benign neoplasm of sigmoid colon: Secondary | ICD-10-CM

## 2020-06-29 DIAGNOSIS — K2970 Gastritis, unspecified, without bleeding: Secondary | ICD-10-CM | POA: Diagnosis not present

## 2020-06-29 DIAGNOSIS — K635 Polyp of colon: Secondary | ICD-10-CM

## 2020-06-29 DIAGNOSIS — K2950 Unspecified chronic gastritis without bleeding: Secondary | ICD-10-CM | POA: Diagnosis not present

## 2020-06-29 DIAGNOSIS — K319 Disease of stomach and duodenum, unspecified: Secondary | ICD-10-CM

## 2020-06-29 MED ORDER — SODIUM CHLORIDE 0.9 % IV SOLN: 500.0000 mL | Freq: Once | INTRAVENOUS | Status: DC

## 2020-06-29 MED ORDER — PANTOPRAZOLE SODIUM 40 MG PO TBEC: 40.0000 mg | DELAYED_RELEASE_TABLET | Freq: Every day | ORAL | 3 refills | Status: DC

## 2020-06-29 MED ORDER — PANTOPRAZOLE SODIUM 40 MG PO TBEC: 40.0000 mg | DELAYED_RELEASE_TABLET | Freq: Every day | ORAL | 0 refills | Status: DC

## 2020-06-29 NOTE — Op Note (Signed)
[facility] Patient Name: Allison Farrell Procedure Date: 06/29/2020 2:41 PM MRN: [id number] Endoscopist: Ladene Artist , MD Age: 49 Referring MD:  Date of Birth: 1971-01-13 Gender: Female Account #: [id number] Procedure:                Upper GI endoscopy Indications:              Dyspepsia, Nausea Medicines:                Monitored Anesthesia Care Procedure:                Pre-Anesthesia Assessment:                           - Prior to the procedure, a History and Physical                            was performed, and patient medications and                            allergies were reviewed. The patient's tolerance of                            previous anesthesia was also reviewed. The risks                            and benefits of the procedure and the sedation                            options and risks were discussed with the patient.                            All questions were answered, and informed consent                            was obtained. Prior Anticoagulants: The patient has                            taken no previous anticoagulant or antiplatelet                            agents. ASA Grade Assessment: II - A patient with                            mild systemic disease. After reviewing the risks                            and benefits, the patient was deemed in                            satisfactory condition to undergo the procedure.                           After obtaining informed consent, the endoscope was  passed under direct vision. Throughout the                            procedure, the patient's blood pressure, pulse, and                            oxygen saturations were monitored continuously. The                            Endoscope was introduced through the mouth, and                            advanced to the second part of duodenum. The upper                            GI endoscopy was accomplished without  difficulty.                            The patient tolerated the procedure well. Scope In: Scope Out: Findings:                 The examined esophagus was normal.                           Patchy mildly erythematous mucosa without bleeding                            was found in the gastric antrum and in the                            prepyloric region of the stomach. Biopsies were                            taken with a cold forceps for histology.                           A small hiatal hernia was present.                           The exam of the stomach was otherwise normal.                           The duodenal bulb and second portion of the                            duodenum were normal. Complications:            No immediate complications. Estimated Blood Loss:     Estimated blood loss was minimal. Impression:               - Normal esophagus.                           - Erythematous mucosa in the antrum and prepyloric  region of the stomach. Biopsied.                           - Small hiatal hernia.                           - Normal duodenal bulb and second portion of the                            duodenum. Recommendation:           - Patient has a contact number available for                            emergencies. The signs and symptoms of potential                            delayed complications were discussed with the                            patient. Return to normal activities tomorrow.                            Written discharge instructions were provided to the                            patient.                           - Resume previous diet.                           - Continue present medications.                           - Await pathology results.                           - Return to GI office in 6 weeks.                           - Protonix (pantoprazole) 40 mg PO daily for 2                            months. Ladene Artist, MD 06/29/2020 3:17:38 PM This report has been signed electronically.

## 2020-06-29 NOTE — Progress Notes (Signed)
Called to room to assist during endoscopic procedure.  Patient ID and intended procedure confirmed with present staff. Received instructions for my participation in the procedure from the performing physician.  

## 2020-06-29 NOTE — Op Note (Signed)
[facility] Patient Name: Allison Farrell Procedure Date: 06/29/2020 2:41 PM MRN: [id number] Endoscopist: Ladene Artist , MD Age: 49 Referring MD:  Date of Birth: Apr 05, 1971 Gender: Female Account #: [id number] Procedure:                Colonoscopy Indications:              Screening for colorectal malignant neoplasm Medicines:                Monitored Anesthesia Care Procedure:                Pre-Anesthesia Assessment:                           - Prior to the procedure, a History and Physical                            was performed, and patient medications and                            allergies were reviewed. The patient's tolerance of                            previous anesthesia was also reviewed. The risks                            and benefits of the procedure and the sedation                            options and risks were discussed with the patient.                            All questions were answered, and informed consent                            was obtained. Prior Anticoagulants: The patient has                            taken no previous anticoagulant or antiplatelet                            agents. ASA Grade Assessment: II - A patient with                            mild systemic disease. After reviewing the risks                            and benefits, the patient was deemed in                            satisfactory condition to undergo the procedure.                           After obtaining informed consent, the colonoscope  was passed under direct vision. Throughout the                            procedure, the patient's blood pressure, pulse, and                            oxygen saturations were monitored continuously. The                            Colonoscope was introduced through the anus and                            advanced to the the cecum, identified by                            appendiceal orifice and  ileocecal valve. The                            ileocecal valve, appendiceal orifice, and rectum                            were photographed. The quality of the bowel                            preparation was excellent. The colonoscopy was                            performed without difficulty. The patient tolerated                            the procedure well. Scope In: 2:45:50 PM Scope Out: 3:04:12 PM Scope Withdrawal Time: 0 hours 14 minutes 49 seconds  Total Procedure Duration: 0 hours 18 minutes 22 seconds  Findings:                 The perianal and digital rectal examinations were                            normal.                           A 7 mm polyp was found in the sigmoid colon. The                            polyp was sessile. The polyp was removed with a                            cold snare. Resection and retrieval were complete.                           The exam was otherwise without abnormality on                            direct and retroflexion views. Complications:  No immediate complications. Estimated blood loss:                            None. Estimated Blood Loss:     Estimated blood loss: none. Impression:               - One 7 mm polyp in the sigmoid colon, removed with                            a cold snare. Resected and retrieved.                           - The examination was otherwise normal on direct                            and retroflexion views. Recommendation:           - Repeat colonoscopy date to be determined after                            pending pathology results are reviewed for                            surveillance/screening based on pathology results.                           - Patient has a contact number available for                            emergencies. The signs and symptoms of potential                            delayed complications were discussed with the                            patient. Return to normal  activities tomorrow.                            Written discharge instructions were provided to the                            patient.                           - Resume previous diet.                           - Continue present medications.                           - Await pathology results. Ladene Artist, MD 06/29/2020 3:06:33 PM This report has been signed electronically.

## 2020-06-29 NOTE — Progress Notes (Signed)
Report to PACU, RN, vss, BBS= Clear.  

## 2020-06-29 NOTE — Patient Instructions (Signed)
HANDOUTS PROVIDED ON: Gastritis, Hiatal Hernia and Colon Polyps  The polyp removed/biopsies taken today have been sent for pathology.  The results can take 1-3 weeks to receive.  When your next colonoscopy should occur will be based on the pathology results.    You may resume your previous diet and medication schedule.  Return to GI office in 6 weeks.  New prescription for Protonix (daily for 2 months) at [facility] on [address] to be picked up.    Thank you for allowing [place] to care for you today!!!      YOU HAD AN ENDOSCOPIC PROCEDURE TODAY AT [facility]:   Refer to the procedure report that was given to you for any specific questions about what was found during the examination.  If the procedure report does not answer your questions, please call your gastroenterologist to clarify.  If you requested that your care partner not be given the details of your procedure findings, then the procedure report has been included in a sealed envelope for you to review at your convenience later.  YOU SHOULD EXPECT: Some feelings of bloating in the abdomen. Passage of more gas than usual.  Walking can help get rid of the air that was put into your GI tract during the procedure and reduce the bloating. If you had a lower endoscopy (such as a colonoscopy or flexible sigmoidoscopy) you may notice spotting of blood in your stool or on the toilet paper. If you underwent a bowel prep for your procedure, you may not have a normal bowel movement for a few days.  Please Note:  You might notice some irritation and congestion in your nose or some drainage.  This is from the oxygen used during your procedure.  There is no need for concern and it should clear up in a day or so.  SYMPTOMS TO REPORT IMMEDIATELY:   Following lower endoscopy (colonoscopy or flexible sigmoidoscopy):  Excessive amounts of blood in the stool  Significant tenderness or worsening of abdominal pains  Swelling of the  abdomen that is new, acute  Fever of 100F or higher   Following upper endoscopy (EGD)  Vomiting of blood or coffee ground material  New chest pain or pain under the shoulder blades  Painful or persistently difficult swallowing  New shortness of breath  Fever of 100F or higher  Black, tarry-looking stools  For urgent or emergent issues, a gastroenterologist can be reached at any hour by calling [phone]. Do not use MyChart messaging for urgent concerns.    DIET:  We do recommend a small meal at first, but then you may proceed to your regular diet.  Drink plenty of fluids but you should avoid alcoholic beverages for 24 hours.  ACTIVITY:  You should plan to take it easy for the rest of today and you should NOT DRIVE or use heavy machinery until tomorrow (because of the sedation medicines used during the test).    FOLLOW UP: Our staff will call the number listed on your records 48-72 hours following your procedure to check on you and address any questions or concerns that you may have regarding the information given to you following your procedure. If we do not reach you, we will leave a message.  We will attempt to reach you two times.  During this call, we will ask if you have developed any symptoms of COVID 19. If you develop any symptoms (ie: fever, flu-like symptoms, shortness of breath, cough etc.)  before then, please call [phone].  If you test positive for Covid 19 in the 2 weeks post procedure, please call and report this information to [place].    If any biopsies were taken you will be contacted by phone or by letter within the next 1-3 weeks.  Please call us at [phone] if you have not heard about the biopsies in 3 weeks.    SIGNATURES/CONFIDENTIALITY: You and/or your care partner have signed paperwork which will be entered into your electronic medical record.  These signatures attest to the fact that that the information above on your After Visit Summary has been  reviewed and is understood.  Full responsibility of the confidentiality of this discharge information lies with you and/or your care-partner.

## 2020-06-29 NOTE — Progress Notes (Signed)
Vs by H Carr,RN

## 2020-07-01 ENCOUNTER — Telehealth: Payer: Self-pay | Admitting: *Deleted

## 2020-07-01 NOTE — Telephone Encounter (Signed)
1. Have you developed a fever since your procedure? no  2.   Have you had an respiratory symptoms (SOB or cough) since your procedure? no  3.   Have you tested positive for COVID 19 since your procedure no  4.   Have you had any family members/close contacts diagnosed with the COVID 19 since your procedure?  no   If yes to any of these questions please route to Joylene John, RN and Joella Prince, RN Follow up Call-  Call back number 06/29/2020  Post procedure Call Back phone  # [phone]  Permission to leave phone message Yes  Some recent data might be hidden     Patient questions:  Do you have a fever, pain , or abdominal swelling? Yes.   Pain Score  0 *  Have you tolerated food without any problems? Yes.    Have you been able to return to your normal activities? Yes.    Do you have any questions about your discharge instructions: Diet   No. Medications  No. Follow up visit  No.  Do you have questions or concerns about your Care? No.  Actions: * If pain score is 4 or above: No action needed, pain <4.

## 2020-07-11 ENCOUNTER — Encounter: Payer: Self-pay | Admitting: Gastroenterology

## 2020-09-28 IMAGING — US US ABDOMEN COMPLETE
1 series · 14 of 25 positions shown · non-contrast
Comparison: None.

CLINICAL DATA: Nausea. Epigastric pain. Nausea, bloating, and
constipation.

EXAM:
ABDOMEN ULTRASOUND COMPLETE

[Series 1: us abdomen complete · 14 of 120 slices shown]
[im 1/120]
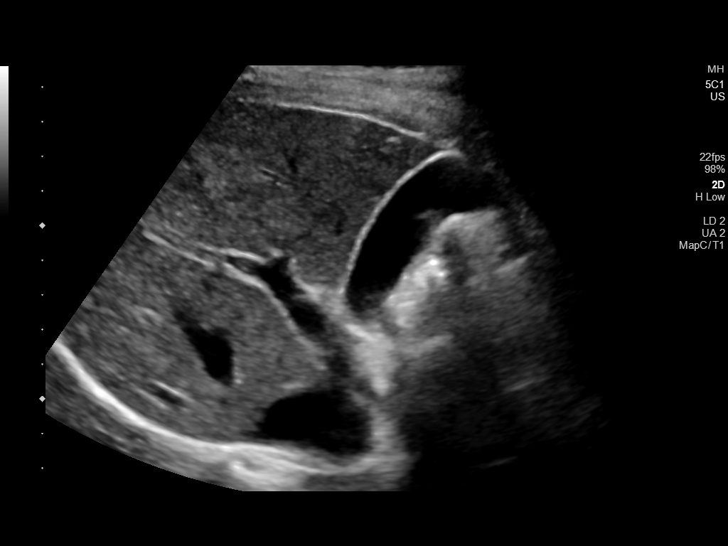
[im 10/120]
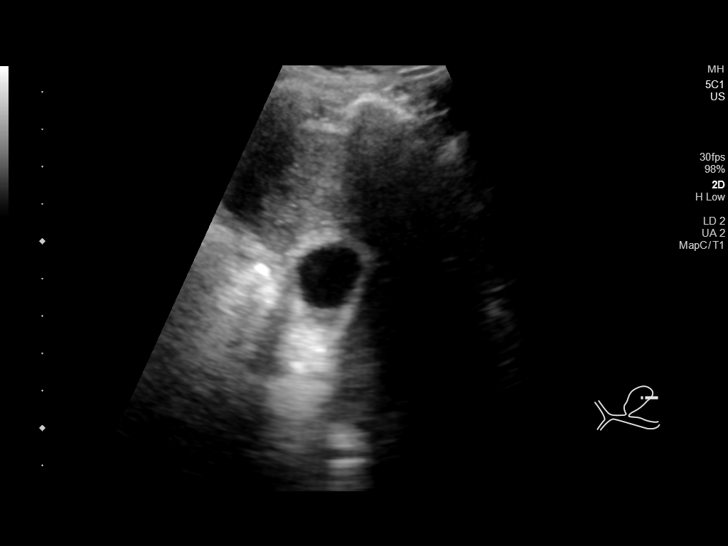
[im 20/120]
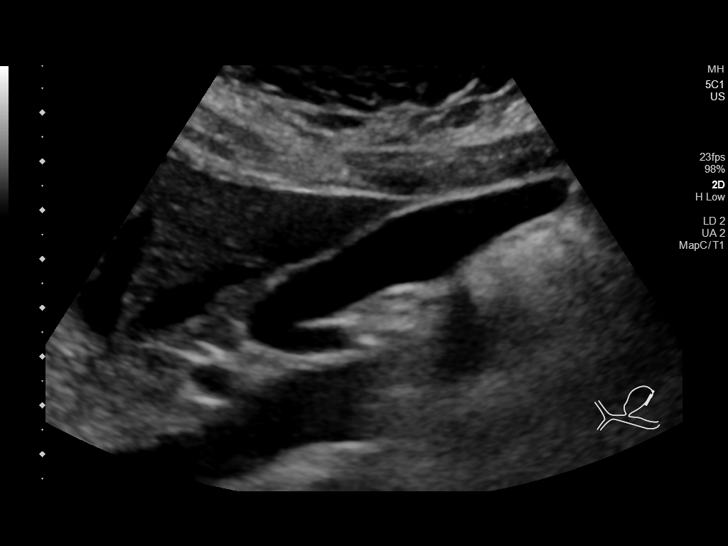
[im 30/120]
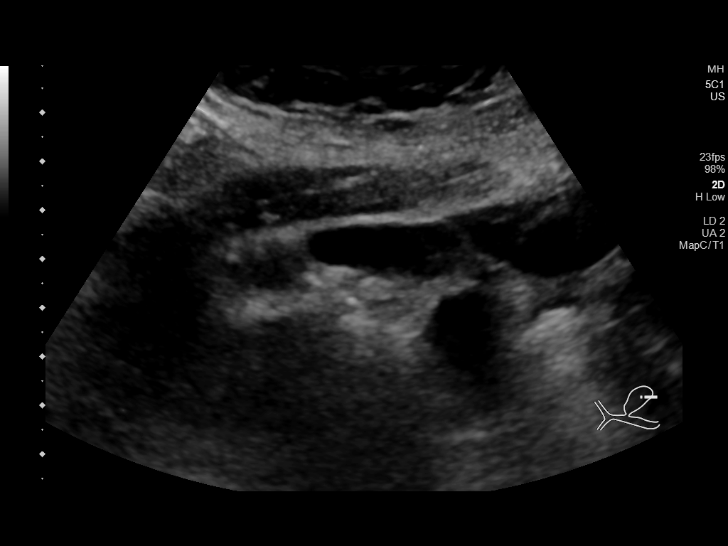
[im 40/120]
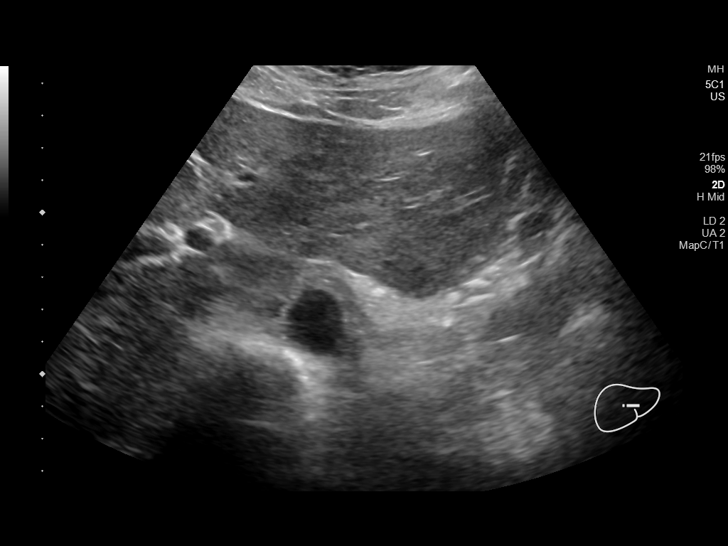
[im 45/120]
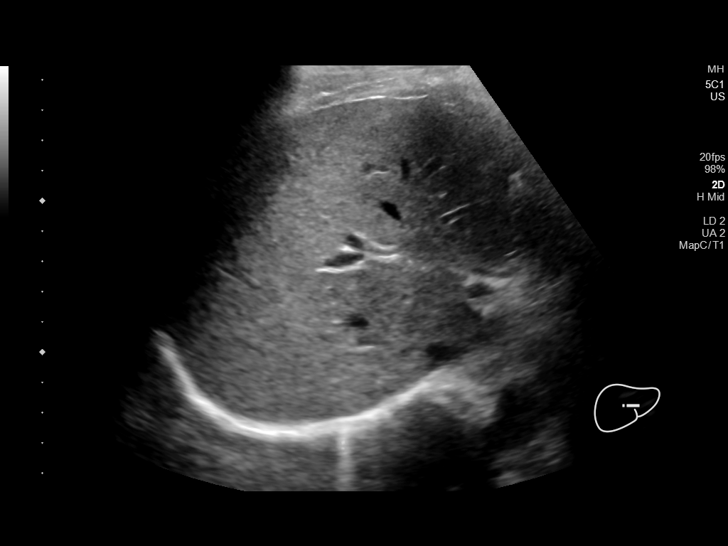
[im 55/120]
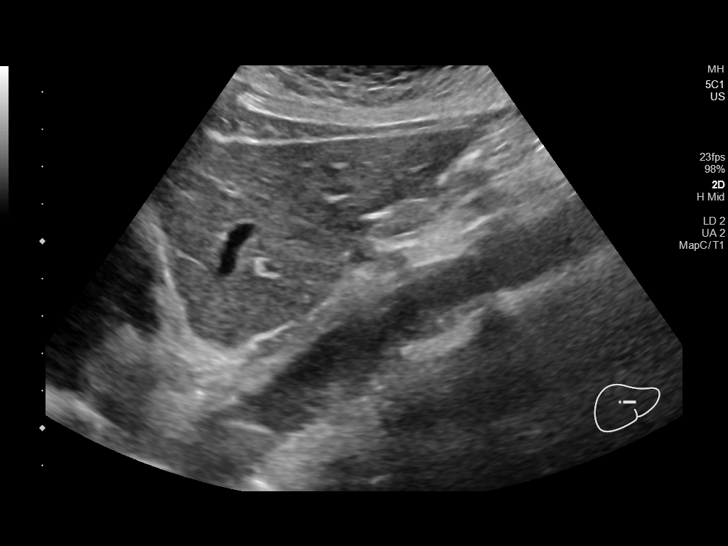
[im 65/120]
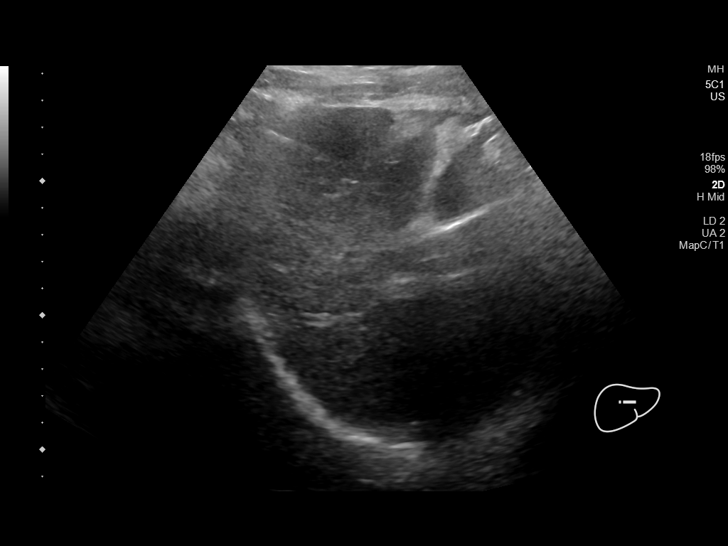
[im 75/120]
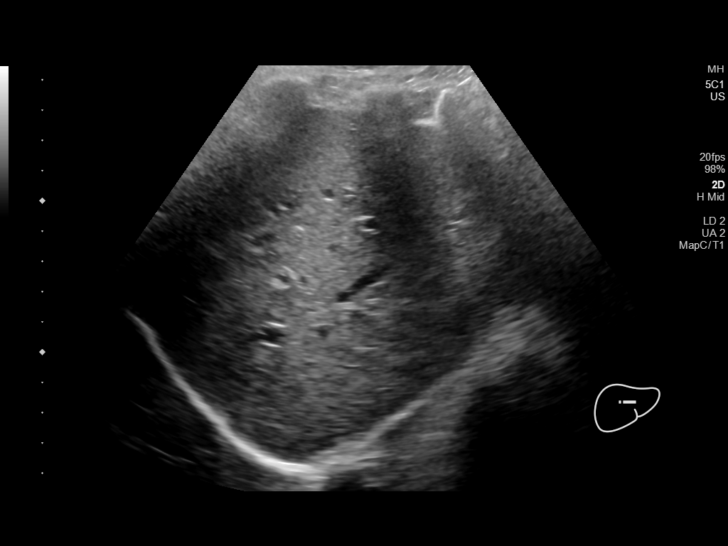
[im 80/120]
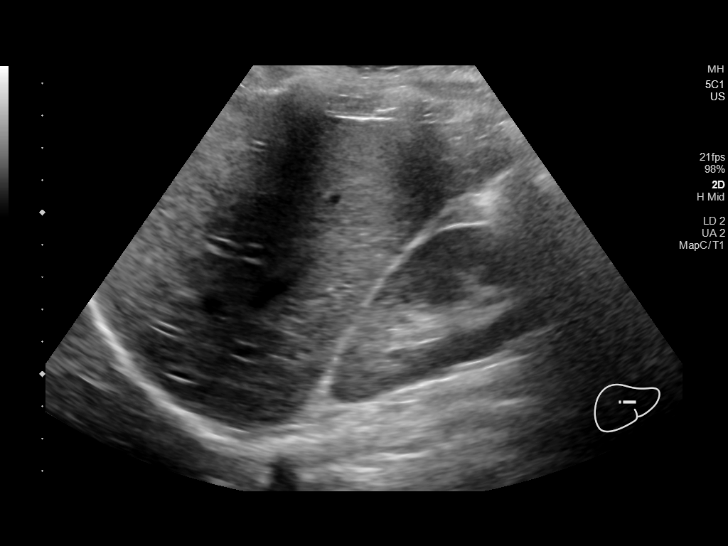
[im 90/120]
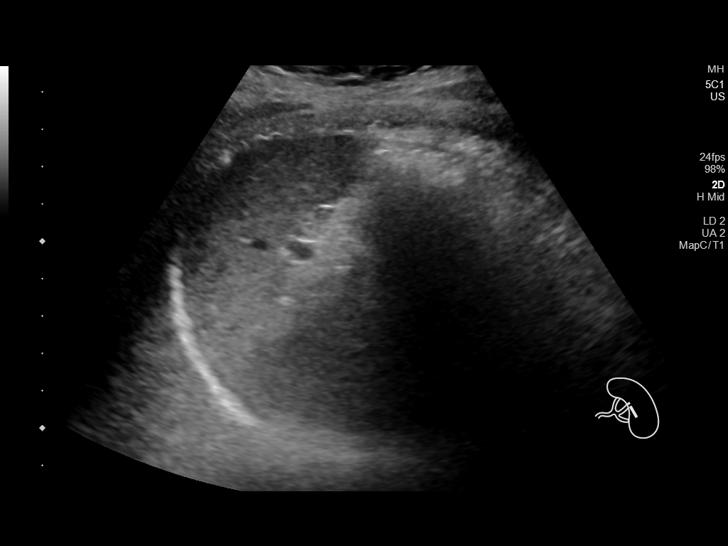
[im 100/120]
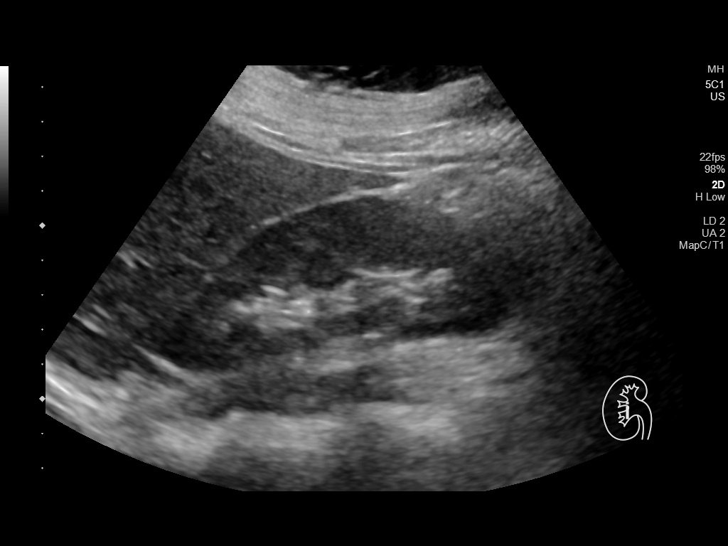
[im 110/120]
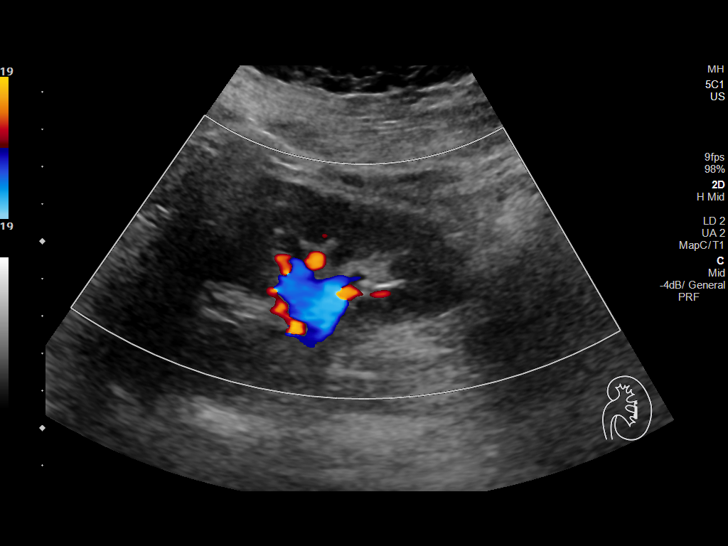
[im 120/120]
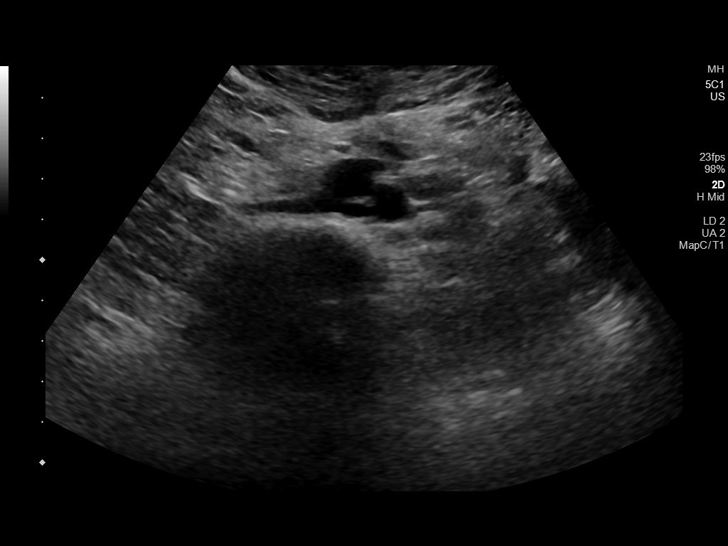

[14 of 25 positions shown; findings below may reference images not displayed]

FINDINGS: Gallbladder: No gallstones or wall thickening visualized. No
sonographic Murphy sign noted by sonographer.

Common bile duct: Diameter: 3.3 mm, within normal limits

Liver: No focal lesion identified. Within normal limits in
parenchymal echogenicity. Portal vein is patent on color Doppler
imaging with normal direction of blood flow towards the liver.

IVC: No abnormality visualized.

Pancreas: Visualized portion unremarkable.

Spleen: Size and appearance within normal limits.

Right Kidney: Length: 10.4 cm, within normal limits. Echogenicity
within normal limits. No mass or hydronephrosis visualized.

Left Kidney: Length: 11.4 cm, within normal limits. Echogenicity
within normal limits. No mass or hydronephrosis visualized.

Abdominal aorta: No aneurysm visualized.

Other findings: None.
IMPRESSION: Normal sonographic appearance of the abdomen. No acute or focal
lesion to explain nausea, bloating, or constipation.

## 2021-01-10 ENCOUNTER — Other Ambulatory Visit: Payer: Self-pay | Admitting: Obstetrics & Gynecology

## 2021-05-25 ENCOUNTER — Encounter: Payer: BC Managed Care – PPO | Admitting: Physician Assistant

## 2021-06-19 ENCOUNTER — Encounter: Payer: Self-pay | Admitting: Family Medicine

## 2021-06-19 ENCOUNTER — Other Ambulatory Visit: Payer: Self-pay

## 2021-06-19 ENCOUNTER — Ambulatory Visit: Payer: Self-pay | Attending: Family Medicine | Admitting: Family Medicine

## 2021-06-19 VITALS — BP 117/71 | HR 53 | Ht 63.0 in | Wt 193.2 lb

## 2021-06-19 DIAGNOSIS — Z13228 Encounter for screening for other metabolic disorders: Secondary | ICD-10-CM

## 2021-06-19 DIAGNOSIS — Z1159 Encounter for screening for other viral diseases: Secondary | ICD-10-CM

## 2021-06-19 DIAGNOSIS — Z0000 Encounter for general adult medical examination without abnormal findings: Secondary | ICD-10-CM

## 2021-06-19 NOTE — Patient Instructions (Signed)
Health Maintenance, Female Adopting a healthy lifestyle and getting preventive care are important in promoting health and wellness. Ask your health care provider about: The right schedule for you to have regular tests and exams. Things you can do on your own to prevent diseases and keep yourself healthy. What should I know about diet, weight, and exercise? Eat a healthy diet  Eat a diet that includes plenty of vegetables, fruits, low-fat dairy products, and lean protein. Do not eat a lot of foods that are high in solid fats, added sugars, or sodium.  Maintain a healthy weight Body mass index (BMI) is used to identify weight problems. It estimates body fat based on height and weight. Your health care provider can help determineyour BMI and help you achieve or maintain a healthy weight. Get regular exercise Get regular exercise. This is one of the most important things you can do for your health. Most adults should: Exercise for at least 150 minutes each week. The exercise should increase your heart rate and make you sweat (moderate-intensity exercise). Do strengthening exercises at least twice a week. This is in addition to the moderate-intensity exercise. Spend less time sitting. Even light physical activity can be beneficial. Watch cholesterol and blood lipids Have your blood tested for lipids and cholesterol at 48 years of age, then havethis test every 5 years. Have your cholesterol levels checked more often if: Your lipid or cholesterol levels are high. You are older than 48 years of age. You are at high risk for heart disease. What should I know about cancer screening? Depending on your health history and family history, you may need to have cancer screening at various ages. This may include screening for: Breast cancer. Cervical cancer. Colorectal cancer. Skin cancer. Lung cancer. What should I know about heart disease, diabetes, and high blood pressure? Blood pressure and heart  disease High blood pressure causes heart disease and increases the risk of stroke. This is more likely to develop in people who have high blood pressure readings, are of African descent, or are overweight. Have your blood pressure checked: Every 3-5 years if you are 18-39 years of age. Every year if you are 40 years old or older. Diabetes Have regular diabetes screenings. This checks your fasting blood sugar level. Have the screening done: Once every three years after age 40 if you are at a normal weight and have a low risk for diabetes. More often and at a younger age if you are overweight or have a high risk for diabetes. What should I know about preventing infection? Hepatitis B If you have a higher risk for hepatitis B, you should be screened for this virus. Talk with your health care provider to find out if you are at risk forhepatitis B infection. Hepatitis C Testing is recommended for: Everyone born from 1945 through 1965. Anyone with known risk factors for hepatitis C. Sexually transmitted infections (STIs) Get screened for STIs, including gonorrhea and chlamydia, if: You are sexually active and are younger than 48 years of age. You are older than 48 years of age and your health care provider tells you that you are at risk for this type of infection. Your sexual activity has changed since you were last screened, and you are at increased risk for chlamydia or gonorrhea. Ask your health care provider if you are at risk. Ask your health care provider about whether you are at high risk for HIV. Your health care provider may recommend a prescription medicine to help   prevent HIV infection. If you choose to take medicine to prevent HIV, you should first get tested for HIV. You should then be tested every 3 months for as long as you are taking the medicine. Pregnancy If you are about to stop having your period (premenopausal) and you may become pregnant, seek counseling before you get  pregnant. Take 400 to 800 micrograms (mcg) of folic acid every day if you become pregnant. Ask for birth control (contraception) if you want to prevent pregnancy. Osteoporosis and menopause Osteoporosis is a disease in which the bones lose minerals and strength with aging. This can result in bone fractures. If you are 65 years old or older, or if you are at risk for osteoporosis and fractures, ask your health care provider if you should: Be screened for bone loss. Take a calcium or vitamin D supplement to lower your risk of fractures. Be given hormone replacement therapy (HRT) to treat symptoms of menopause. Follow these instructions at home: Lifestyle Do not use any products that contain nicotine or tobacco, such as cigarettes, e-cigarettes, and chewing tobacco. If you need help quitting, ask your health care provider. Do not use street drugs. Do not share needles. Ask your health care provider for help if you need support or information about quitting drugs. Alcohol use Do not drink alcohol if: Your health care provider tells you not to drink. You are pregnant, may be pregnant, or are planning to become pregnant. If you drink alcohol: Limit how much you use to 0-1 drink a day. Limit intake if you are breastfeeding. Be aware of how much alcohol is in your drink. In the [place] one drink equals one 12 oz bottle of beer (355 mL), one 5 oz glass of wine (148 mL), or one 1 oz glass of hard liquor (44 mL). General instructions Schedule regular health, dental, and eye exams. Stay current with your vaccines. Tell your health care provider if: You often feel depressed. You have ever been abused or do not feel safe at home. Summary Adopting a healthy lifestyle and getting preventive care are important in promoting health and wellness. Follow your health care provider's instructions about healthy diet, exercising, and getting tested or screened for diseases. Follow your health care provider's  instructions on monitoring your cholesterol and blood pressure. This information is not intended to replace advice given to you by your health care provider. Make sure you discuss any questions you have with your healthcare provider. Document Revised: 10/01/2018 Document Reviewed: 10/01/2018 Elsevier Patient Education  2022 Elsevier Inc.  

## 2021-06-19 NOTE — Progress Notes (Signed)
Physical  Pt states she had pap and mammogram at the same time.

## 2021-06-19 NOTE — Progress Notes (Signed)
Subjective:  Patient ID: Allison Farrell, female    DOB: 1971-07-23  Age: 50 y.o. MRN: [id number]  CC: Annual Exam   HPI Allison Farrell is a 50 y.o. year old female here for an annual physical exam.  Interval History: Her PAP smear was done at [facility] on 12/29/2020 and she was informed it was normal. Mammogram was also normal on 12/29/2020.  Scanned copies of both Pap smear and mammogram sent by patient through [facility]. Last colonoscopy was in 06/29/2020, one hyperplastic polyp retrieved and follow-up colonoscopy recommended in 10 years.  She is doing intermittent fasting and going to the Gym 2-3 x/week. Denies acute concerns today. Past Medical History:  Diagnosis Date   Anemia    Fibrocystic breast    Inclusion cyst of vulva    Labial abscess 2011   Sinus disease    TMJ arthralgia 2012    Past Surgical History:  Procedure Laterality Date   KNEE SURGERY  1990   left   WISDOM TOOTH EXTRACTION  1991    Family History  Problem Relation Age of Onset   Breast cancer Mother 40   Cancer Mother    Lung cancer Father    Diabetes Father    Cancer Father        lung   Hypertension Father    Kidney disease Paternal Grandmother    Stroke Paternal Grandmother    Stroke Maternal Grandmother    Bladder Cancer Brother    Colon cancer Neg Hx    Colon polyps Neg Hx    Esophageal cancer Neg Hx    Stomach cancer Neg Hx    Rectal cancer Neg Hx     Allergies  Allergen Reactions   Shellfish Allergy Hives and Swelling   Fish Allergy    Penicillins     Does not remember possibly when child   Tamiflu [Oseltamivir Phosphate] Rash    Outpatient Medications Prior to Visit  Medication Sig Dispense Refill   Ascorbic Acid (VITAMIN C) 1000 MG tablet Vitamin C     ELDERBERRY PO Elderberry     Multiple Vitamin (MULTIVITAMIN) tablet Take 1 tablet by mouth daily. Reported on 02/09/2016     pantoprazole (PROTONIX) 40 MG tablet Take 1 tablet (40 mg total) by mouth daily. For 2 months. Take  on empty stomach 20-30 minutes before breakfast. 60 tablet 0   No facility-administered medications prior to visit.     ROS Review of Systems  Constitutional:  Negative for activity change, appetite change and fatigue.  HENT:  Negative for congestion, sinus pressure and sore throat.   Eyes:  Negative for visual disturbance.  Respiratory:  Negative for cough, chest tightness, shortness of breath and wheezing.   Cardiovascular:  Negative for chest pain and palpitations.  Gastrointestinal:  Negative for abdominal distention, abdominal pain and constipation.  Endocrine: Negative for polydipsia.  Genitourinary:  Negative for dysuria and frequency.  Musculoskeletal:  Negative for arthralgias and back pain.  Skin:  Negative for rash.  Neurological:  Negative for tremors, light-headedness and numbness.  Hematological:  Does not bruise/bleed easily.  Psychiatric/Behavioral:  Negative for agitation and behavioral problems.    Objective:  BP 117/71   Pulse (!) 53   Ht 5' 3"  (1.6 m)   Wt 193 lb 3.2 oz (87.6 kg)   SpO2 100%   BMI 34.22 kg/m   BP/Weight 06/19/2021 06/29/2020 2/83/1517  Systolic BP 616 073 710  Diastolic BP 71 77 80  Wt. (Lbs) 193.2 193 193  BMI 34.22 34.19 34.19      Physical Exam Constitutional:      General: She is not in acute distress.    Appearance: She is well-developed. She is not diaphoretic.  HENT:     Head: Normocephalic.     Right Ear: External ear normal.     Left Ear: External ear normal.     Nose: Nose normal.     Mouth/Throat:     Mouth: Mucous membranes are moist.  Eyes:     Extraocular Movements: Extraocular movements intact.     Conjunctiva/sclera: Conjunctivae normal.     Pupils: Pupils are equal, round, and reactive to light.  Neck:     Vascular: No JVD.  Cardiovascular:     Rate and Rhythm: Regular rhythm. Bradycardia present.     Pulses: Normal pulses.     Heart sounds: Normal heart sounds. No murmur heard.   No gallop.  Pulmonary:      Effort: Pulmonary effort is normal. No respiratory distress.     Breath sounds: Normal breath sounds. No wheezing or rales.  Chest:     Chest wall: No tenderness.  Abdominal:     General: Bowel sounds are normal. There is no distension.     Palpations: Abdomen is soft. There is no mass.     Tenderness: There is no abdominal tenderness.  Musculoskeletal:        General: No tenderness. Normal range of motion.     Cervical back: Normal range of motion.  Skin:    General: Skin is warm and dry.  Neurological:     Mental Status: She is alert and oriented to person, place, and time.     Deep Tendon Reflexes: Reflexes are normal and symmetric.  Psychiatric:        Mood and Affect: Mood normal.    CMP Latest Ref Rng & Units 05/10/2020 12/16/2019 10/31/2015  Glucose 70 - 99 mg/dL 91 91 88  BUN 6 - 23 mg/dL 14 14 11   Creatinine 0.40 - 1.20 mg/dL 0.76 0.69 0.73  Sodium 135 - 145 mEq/L 137 139 137  Potassium 3.5 - 5.1 mEq/L 4.4 4.7 4.4  Chloride 96 - 112 mEq/L 103 105 102  CO2 19 - 32 mEq/L 29 21 28   Calcium 8.4 - 10.5 mg/dL 9.5 9.2 9.2  Total Protein 6.0 - 8.3 g/dL 7.3 7.2 7.1  Total Bilirubin 0.2 - 1.2 mg/dL 0.3 <0.2 0.4  Alkaline Phos 39 - 117 U/L 55 60 50  AST 0 - 37 U/L 16 21 15   ALT 0 - 35 U/L 18 11 16     Lipid Panel     Component Value Date/Time   CHOL 174 12/16/2019 0933   TRIG 44 12/16/2019 0933   HDL 59 12/16/2019 0933   CHOLHDL 2.9 12/16/2019 0933   LDLCALC 106 (H) 12/16/2019 0933    CBC    Component Value Date/Time   WBC 4.7 05/10/2020 1106   RBC 5.21 (H) 05/10/2020 1106   HGB 12.2 05/10/2020 1106   HGB 12.3 12/16/2019 0933   HCT 38.4 05/10/2020 1106   HCT 39.3 12/16/2019 0933   PLT 299.0 05/10/2020 1106   PLT 323 12/16/2019 0933   MCV 73.6 (L) 05/10/2020 1106   MCV 74 (L) 12/16/2019 0933   MCH 23.2 (L) 12/16/2019 0933   MCH 23.8 (A) 02/09/2016 0855   MCH 22.7 (L) 10/31/2015 1256   MCHC 31.9 05/10/2020 1106   RDW 13.8 05/10/2020 1106   RDW 13.3  12/16/2019 0933   LYMPHSABS 2.0 05/10/2020 1106   LYMPHSABS 1.8 12/16/2019 0933   MONOABS 0.4 05/10/2020 1106   EOSABS 0.1 05/10/2020 1106   EOSABS 0.1 12/16/2019 0933   BASOSABS 0.0 05/10/2020 1106   BASOSABS 0.1 12/16/2019 0933    Lab Results  Component Value Date   HGBA1C 5.4 12/16/2019    Assessment & Plan:  1. Annual physical exam Counseled on 150 minutes of exercise per week, healthy eating (including decreased daily intake of saturated fats, cholesterol, added sugars, sodium),routine healthcare maintenance.  - CMP14+EGFR - CBC with Differential/Platelet  2. Need for hepatitis C screening test - HCV RNA quant rflx ultra or genotyp([facility])  3. Screening for metabolic disorder - Hemoglobin A1c - Lipid panel   Health Care Maintenance: Up-to-date on Pap smear and mammogram from 12/30/2018 (performed at [facility]) No orders of the defined types were placed in this encounter.   Follow-up: Return in about 1 year (around 06/19/2022) for Annual physical exam.       Charlott Rakes, MD, FAAFP. [facility] and [place], [place]   06/19/2021, 11:24 AM

## 2021-06-20 ENCOUNTER — Encounter: Payer: Self-pay | Admitting: Family Medicine

## 2021-06-21 ENCOUNTER — Encounter: Payer: Self-pay | Admitting: Family Medicine

## 2021-06-21 LAB — CMP14+EGFR
ALT: 15 IU/L (ref 0–32)
AST: 19 IU/L (ref 0–40)
Albumin/Globulin Ratio: 1.1 — ABNORMAL LOW (ref 1.2–2.2)
Albumin: 3.9 g/dL (ref 3.8–4.8)
Alkaline Phosphatase: 60 IU/L (ref 44–121)
BUN/Creatinine Ratio: 11 (ref 9–23)
BUN: 9 mg/dL (ref 6–24)
Bilirubin Total: 0.3 mg/dL (ref 0.0–1.2)
CO2: 23 mmol/L (ref 20–29)
Calcium: 9.5 mg/dL (ref 8.7–10.2)
Chloride: 105 mmol/L (ref 96–106)
Creatinine, Ser: 0.79 mg/dL (ref 0.57–1.00)
Globulin, Total: 3.4 g/dL (ref 1.5–4.5)
Glucose: 93 mg/dL (ref 65–99)
Potassium: 4 mmol/L (ref 3.5–5.2)
Sodium: 139 mmol/L (ref 134–144)
Total Protein: 7.3 g/dL (ref 6.0–8.5)
eGFR: 93 mL/min/{1.73_m2} (ref >59)

## 2021-06-21 LAB — CBC WITH DIFFERENTIAL/PLATELET
Basophils Absolute: 0 10*3/uL (ref 0.0–0.2)
Basos: 1 %
EOS (ABSOLUTE): 0.1 10*3/uL (ref 0.0–0.4)
Eos: 2 %
Hematocrit: 38.5 % (ref 34.0–46.6)
Hemoglobin: 12.1 g/dL (ref 11.1–15.9)
Immature Grans (Abs): 0 10*3/uL (ref 0.0–0.1)
Immature Granulocytes: 0 %
Lymphocytes Absolute: 2 10*3/uL (ref 0.7–3.1)
Lymphs: 48 %
MCH: 22.9 pg — ABNORMAL LOW (ref 26.6–33.0)
MCHC: 31.4 g/dL — ABNORMAL LOW (ref 31.5–35.7)
MCV: 73 fL — ABNORMAL LOW (ref 79–97)
Monocytes Absolute: 0.3 10*3/uL (ref 0.1–0.9)
Monocytes: 7 %
Neutrophils Absolute: 1.7 10*3/uL (ref 1.4–7.0)
Neutrophils: 42 %
Platelets: 309 10*3/uL (ref 150–450)
RBC: 5.28 x10E6/uL (ref 3.77–5.28)
RDW: 13.3 % (ref 11.7–15.4)
WBC: 4 10*3/uL (ref 3.4–10.8)

## 2021-06-21 LAB — LIPID PANEL
Chol/HDL Ratio: 3 ratio (ref 0.0–4.4)
Cholesterol, Total: 178 mg/dL (ref 100–199)
HDL: 59 mg/dL (ref >39)
LDL Chol Calc (NIH): 107 mg/dL — ABNORMAL HIGH (ref 0–99)
Triglycerides: 65 mg/dL (ref 0–149)
VLDL Cholesterol Cal: 12 mg/dL (ref 5–40)

## 2021-06-21 LAB — HCV RNA QUANT RFLX ULTRA OR GENOTYP: HCV Quant Baseline: NOT DETECTED IU/mL

## 2021-06-21 LAB — HEMOGLOBIN A1C
Est. average glucose Bld gHb Est-mCnc: 114 mg/dL
Hgb A1c MFr Bld: 5.6 % (ref 4.8–5.6)

## 2021-07-19 ENCOUNTER — Encounter: Payer: Self-pay | Admitting: Family Medicine

## 2021-10-26 DIAGNOSIS — Z20822 Contact with and (suspected) exposure to covid-19: Secondary | ICD-10-CM | POA: Diagnosis not present

## 2022-01-02 DIAGNOSIS — Z1211 Encounter for screening for malignant neoplasm of colon: Secondary | ICD-10-CM | POA: Diagnosis not present

## 2022-01-02 DIAGNOSIS — Z6832 Body mass index (BMI) 32.0-32.9, adult: Secondary | ICD-10-CM | POA: Diagnosis not present

## 2022-01-02 DIAGNOSIS — Z1239 Encounter for other screening for malignant neoplasm of breast: Secondary | ICD-10-CM | POA: Diagnosis not present

## 2022-01-02 DIAGNOSIS — R030 Elevated blood-pressure reading, without diagnosis of hypertension: Secondary | ICD-10-CM | POA: Diagnosis not present

## 2022-01-02 DIAGNOSIS — Z1231 Encounter for screening mammogram for malignant neoplasm of breast: Secondary | ICD-10-CM | POA: Diagnosis not present

## 2022-01-02 DIAGNOSIS — Z01419 Encounter for gynecological examination (general) (routine) without abnormal findings: Secondary | ICD-10-CM | POA: Diagnosis not present

## 2022-01-02 DIAGNOSIS — Z124 Encounter for screening for malignant neoplasm of cervix: Secondary | ICD-10-CM | POA: Diagnosis not present

## 2022-01-02 LAB — HM PAP SMEAR: HM Pap smear: NEGATIVE

## 2022-08-01 ENCOUNTER — Encounter: Payer: Self-pay | Admitting: Family Medicine

## 2022-08-01 ENCOUNTER — Ambulatory Visit: Payer: 59 | Attending: Family Medicine | Admitting: Family Medicine

## 2022-08-01 VITALS — BP 163/78 | HR 64 | Temp 98.1°F | Ht 63.0 in | Wt 183.4 lb

## 2022-08-01 DIAGNOSIS — R030 Elevated blood-pressure reading, without diagnosis of hypertension: Secondary | ICD-10-CM | POA: Diagnosis not present

## 2022-08-01 DIAGNOSIS — R21 Rash and other nonspecific skin eruption: Secondary | ICD-10-CM

## 2022-08-01 DIAGNOSIS — Z131 Encounter for screening for diabetes mellitus: Secondary | ICD-10-CM

## 2022-08-01 DIAGNOSIS — Z0000 Encounter for general adult medical examination without abnormal findings: Secondary | ICD-10-CM

## 2022-08-01 DIAGNOSIS — R718 Other abnormality of red blood cells: Secondary | ICD-10-CM | POA: Diagnosis not present

## 2022-08-01 MED ORDER — CLOTRIMAZOLE 1 % EX CREA: 1.0000 | TOPICAL_CREAM | Freq: Two times a day (BID) | CUTANEOUS | 1 refills | Status: DC

## 2022-08-01 NOTE — Progress Notes (Signed)
Subjective:  Patient ID: Allison Farrell, female    DOB: Sep 11, 1971  Age: 51 y.o. MRN: [id number]  CC: Annual Exam (CPE. Discuss menopause. /No to flu vax.)   HPI Allison Farrell is a 51 y.o. year old female who presents today for an annual physical exam.  Interval History: Saw Dr Florene Glen her OBGYN  on 01/02/2022 and Her PAP smear and mammogram were Within normal limit. Colonoscopy was in 06/2020. Sees a Dentist every 6 months. She tries to go to the [place] as often as she can. She left her job 1 year ago and is working for herself now, working from home.  She might have a night sweat once in a while and is wondering when she will be attaining menopause.  Friends have attained menopause.  Her menstrual cycles are regular. BP is elevated and she has no known history of HTN.  Endorses eating a sandwich from [employer] just before coming for this appointment but fluid was not overly salty. She has had a rash on her left heel to which she has been applying OTC Lamisil.  States she has had athlete's foot in the past. Past Medical History:  Diagnosis Date   Anemia    Fibrocystic breast    GERD (gastroesophageal reflux disease)    Heart murmur    Inclusion cyst of vulva    Labial abscess 2011   Sinus disease    TMJ arthralgia 2012    Past Surgical History:  Procedure Laterality Date   KNEE SURGERY  10/22/1988   left   WISDOM TOOTH EXTRACTION  10/22/1989    Family History  Problem Relation Age of Onset   Breast cancer Mother 1   Cancer Mother    Lung cancer Father    Diabetes Father    Cancer Father        lung   Hypertension Father    Kidney disease Paternal Grandmother    Stroke Paternal Grandmother    Cancer Brother    Stroke Maternal Grandmother    Bladder Cancer Brother    Stroke Maternal Aunt    Colon cancer Neg Hx    Colon polyps Neg Hx    Esophageal cancer Neg Hx    Stomach cancer Neg Hx    Rectal cancer Neg Hx     Social History   Socioeconomic History   Marital  status: Single    Spouse name: Not on file   Number of children: Not on file   Years of education: Not on file   Highest education level: Not on file  Occupational History   Not on file  Tobacco Use   Smoking status: Never   Smokeless tobacco: Never  Substance and Sexual Activity   Alcohol use: Yes    Alcohol/week: 3.0 standard drinks of alcohol    Types: 3 Standard drinks or equivalent per week    Comment: occasionally   Drug use: No   Sexual activity: Not Currently    Birth control/protection: Abstinence  Other Topics Concern   Not on file  Social History Narrative   Not on file   Social Determinants of Health   Financial Resource Strain: Not on file  Food Insecurity: Not on file  Transportation Needs: Not on file  Physical Activity: Not on file  Stress: Not on file  Social Connections: Not on file    Allergies  Allergen Reactions   Shellfish Allergy Hives and Swelling   Fish Allergy    Penicillins  Does not remember possibly when child   Tamiflu [Oseltamivir Phosphate] Rash    Outpatient Medications Prior to Visit  Medication Sig Dispense Refill   Multiple Vitamin (MULTIVITAMIN) tablet Take 1 tablet by mouth daily. Reported on 02/09/2016     Ascorbic Acid (VITAMIN C) 1000 MG tablet Vitamin C (Patient not taking: Reported on 08/01/2022)     ELDERBERRY PO Elderberry (Patient not taking: Reported on 08/01/2022)     pantoprazole (PROTONIX) 40 MG tablet Take 1 tablet (40 mg total) by mouth daily. For 2 months. Take on empty stomach 20-30 minutes before breakfast. 60 tablet 0   No facility-administered medications prior to visit.     ROS Review of Systems  Constitutional:  Negative for activity change, appetite change and fatigue.  HENT:  Negative for congestion, sinus pressure and sore throat.   Eyes:  Negative for visual disturbance.  Respiratory:  Negative for cough, chest tightness, shortness of breath and wheezing.   Cardiovascular:  Negative for chest  pain and palpitations.  Gastrointestinal:  Negative for abdominal distention, abdominal pain and constipation.  Endocrine: Negative for polydipsia.  Genitourinary:  Negative for dysuria and frequency.  Musculoskeletal:  Negative for arthralgias and back pain.  Skin:  Positive for rash.  Neurological:  Negative for tremors, light-headedness and numbness.  Hematological:  Does not bruise/bleed easily.  Psychiatric/Behavioral:  Negative for agitation and behavioral problems.     Objective:  BP (!) 163/78   Pulse 64   Temp 98.1 F (36.7 C) (Oral)   Ht 5' 3"  (1.6 m)   Wt 183 lb 6.4 oz (83.2 kg)   SpO2 100%   BMI 32.49 kg/m      08/01/2022    3:55 PM 08/01/2022    3:02 PM 06/19/2021   10:29 AM  BP/Weight  Systolic BP 528 413 244  Diastolic BP 78 84 71  Wt. (Lbs)  183.4 193.2  BMI  32.49 kg/m2 34.22 kg/m2    Wt Readings from Last 3 Encounters:  08/01/22 183 lb 6.4 oz (83.2 kg)  06/19/21 193 lb 3.2 oz (87.6 kg)  06/29/20 193 lb (87.5 kg)      Physical Exam Constitutional:      General: She is not in acute distress.    Appearance: She is well-developed. She is obese. She is not diaphoretic.  HENT:     Head: Normocephalic.     Right Ear: External ear normal.     Left Ear: External ear normal.     Nose: Nose normal.     Mouth/Throat:     Mouth: Mucous membranes are moist.  Eyes:     Extraocular Movements: Extraocular movements intact.     Conjunctiva/sclera: Conjunctivae normal.     Pupils: Pupils are equal, round, and reactive to light.  Neck:     Vascular: No JVD.  Cardiovascular:     Rate and Rhythm: Normal rate and regular rhythm.     Pulses: Normal pulses.     Heart sounds: Normal heart sounds. No murmur heard.    No gallop.  Pulmonary:     Effort: Pulmonary effort is normal. No respiratory distress.     Breath sounds: Normal breath sounds. No wheezing or rales.  Chest:     Chest wall: No tenderness.  Abdominal:     General: Bowel sounds are normal.  There is no distension.     Palpations: Abdomen is soft. There is no mass.     Tenderness: There is no abdominal tenderness.  Musculoskeletal:  General: No tenderness. Normal range of motion.     Cervical back: Normal range of motion.  Skin:    General: Skin is warm and dry.     Comments: Hypopigmented rash on lateral aspect of left sole  Neurological:     Mental Status: She is alert and oriented to person, place, and time.     Deep Tendon Reflexes: Reflexes are normal and symmetric.  Psychiatric:        Mood and Affect: Mood normal.        Latest Ref Rng & Units 06/19/2021   11:49 AM 05/10/2020   11:06 AM 12/16/2019    9:33 AM  CMP  Glucose 65 - 99 mg/dL 93  91  91   BUN 6 - 24 mg/dL 9  14  14    Creatinine 0.57 - 1.00 mg/dL 0.79  0.76  0.69   Sodium 134 - 144 mmol/L 139  137  139   Potassium 3.5 - 5.2 mmol/L 4.0  4.4  4.7   Chloride 96 - 106 mmol/L 105  103  105   CO2 20 - 29 mmol/L 23  29  21    Calcium 8.7 - 10.2 mg/dL 9.5  9.5  9.2   Total Protein 6.0 - 8.5 g/dL 7.3  7.3  7.2   Total Bilirubin 0.0 - 1.2 mg/dL 0.3  0.3  <0.2   Alkaline Phos 44 - 121 IU/L 60  55  60   AST 0 - 40 IU/L 19  16  21    ALT 0 - 32 IU/L 15  18  11      Lipid Panel     Component Value Date/Time   CHOL 178 06/19/2021 1149   TRIG 65 06/19/2021 1149   HDL 59 06/19/2021 1149   CHOLHDL 3.0 06/19/2021 1149   LDLCALC 107 (H) 06/19/2021 1149    CBC    Component Value Date/Time   WBC 4.0 06/19/2021 1149   WBC 4.7 05/10/2020 1106   RBC 5.28 06/19/2021 1149   RBC 5.21 (H) 05/10/2020 1106   HGB 12.1 06/19/2021 1149   HCT 38.5 06/19/2021 1149   PLT 309 06/19/2021 1149   MCV 73 (L) 06/19/2021 1149   MCH 22.9 (L) 06/19/2021 1149   MCH 23.8 (A) 02/09/2016 0855   MCH 22.7 (L) 10/31/2015 1256   MCHC 31.4 (L) 06/19/2021 1149   MCHC 31.9 05/10/2020 1106   RDW 13.3 06/19/2021 1149   LYMPHSABS 2.0 06/19/2021 1149   MONOABS 0.4 05/10/2020 1106   EOSABS 0.1 06/19/2021 1149   BASOSABS 0.0  06/19/2021 1149    Lab Results  Component Value Date   HGBA1C 5.6 06/19/2021    Assessment & Plan:  1. Annual physical exam Counseled on 150 minutes of exercise per week, healthy eating (including decreased daily intake of saturated fats, cholesterol, added sugars, sodium), routine healthcare maintenance.  - CMP14+EGFR - CBC with Differential/Platelet  2. Screening for diabetes mellitus - Hemoglobin A1c  3. Elevated blood pressure reading in office without diagnosis of hypertension Blood pressure was normal at last office visit We will work on lifestyle modification and reassess at her next visit. Counseled on blood pressure goal of less than 130/80, low-sodium, DASH diet, medication compliance, 150 minutes of moderate intensity exercise per week. Discussed medication compliance, adverse effects.  4. Rash and nonspecific skin eruption Suspicious for tinea pedis Discussed foot care - clotrimazole (LOTRIMIN) 1 % cream; Apply 1 Application topically 2 (two) times daily.  Dispense: 60 g; Refill: 1  Health Care Maintenance: Pap smear and mammogram completed with GYN, up-to-date on colonoscopy Meds ordered this encounter  Medications   clotrimazole (LOTRIMIN) 1 % cream    Sig: Apply 1 Application topically 2 (two) times daily.    Dispense:  60 g    Refill:  1    Follow-up: Return in about 3 months (around 11/01/2022) for Blood Pressure follow-up.       Charlott Rakes, MD, FAAFP. [facility] and [place], [place]   08/01/2022, 5:31 PM

## 2022-08-01 NOTE — Patient Instructions (Signed)

## 2022-08-02 ENCOUNTER — Other Ambulatory Visit: Payer: Self-pay | Admitting: Family Medicine

## 2022-08-02 ENCOUNTER — Encounter: Payer: Self-pay | Admitting: Family Medicine

## 2022-08-02 DIAGNOSIS — R718 Other abnormality of red blood cells: Secondary | ICD-10-CM

## 2022-08-02 LAB — CBC WITH DIFFERENTIAL/PLATELET
Basophils Absolute: 0 10*3/uL (ref 0.0–0.2)
Basos: 1 %
EOS (ABSOLUTE): 0 10*3/uL (ref 0.0–0.4)
Eos: 1 %
Hematocrit: 37.9 % (ref 34.0–46.6)
Hemoglobin: 11.7 g/dL (ref 11.1–15.9)
Immature Grans (Abs): 0 10*3/uL (ref 0.0–0.1)
Immature Granulocytes: 0 %
Lymphocytes Absolute: 1.5 10*3/uL (ref 0.7–3.1)
Lymphs: 42 %
MCH: 23 pg — ABNORMAL LOW (ref 26.6–33.0)
MCHC: 30.9 g/dL — ABNORMAL LOW (ref 31.5–35.7)
MCV: 75 fL — ABNORMAL LOW (ref 79–97)
Monocytes Absolute: 0.3 10*3/uL (ref 0.1–0.9)
Monocytes: 7 %
Neutrophils Absolute: 1.8 10*3/uL (ref 1.4–7.0)
Neutrophils: 49 %
Platelets: 304 10*3/uL (ref 150–450)
RBC: 5.08 x10E6/uL (ref 3.77–5.28)
RDW: 12.9 % (ref 11.7–15.4)
WBC: 3.7 10*3/uL (ref 3.4–10.8)

## 2022-08-02 LAB — CMP14+EGFR
ALT: 11 IU/L (ref 0–32)
AST: 18 IU/L (ref 0–40)
Albumin/Globulin Ratio: 1.5 (ref 1.2–2.2)
Albumin: 4.1 g/dL (ref 3.9–4.9)
Alkaline Phosphatase: 57 IU/L (ref 44–121)
BUN/Creatinine Ratio: 11 (ref 9–23)
BUN: 9 mg/dL (ref 6–24)
Bilirubin Total: 0.2 mg/dL (ref 0.0–1.2)
CO2: 24 mmol/L (ref 20–29)
Calcium: 9.1 mg/dL (ref 8.7–10.2)
Chloride: 106 mmol/L (ref 96–106)
Creatinine, Ser: 0.8 mg/dL (ref 0.57–1.00)
Globulin, Total: 2.7 g/dL (ref 1.5–4.5)
Glucose: 113 mg/dL — ABNORMAL HIGH (ref 70–99)
Potassium: 3.4 mmol/L — ABNORMAL LOW (ref 3.5–5.2)
Sodium: 141 mmol/L (ref 134–144)
Total Protein: 6.8 g/dL (ref 6.0–8.5)
eGFR: 90 mL/min/{1.73_m2} (ref >59)

## 2022-08-02 LAB — HEMOGLOBIN A1C
Est. average glucose Bld gHb Est-mCnc: 108 mg/dL
Hgb A1c MFr Bld: 5.4 % (ref 4.8–5.6)

## 2022-08-03 ENCOUNTER — Other Ambulatory Visit: Payer: Self-pay | Admitting: Family Medicine

## 2022-08-03 DIAGNOSIS — Z13220 Encounter for screening for lipoid disorders: Secondary | ICD-10-CM

## 2022-08-06 LAB — IRON,TIBC AND FERRITIN PANEL
Ferritin: 81 ng/mL (ref 15–150)
Iron Saturation: 25 % (ref 15–55)
Iron: 59 ug/dL (ref 27–159)
Total Iron Binding Capacity: 238 ug/dL — ABNORMAL LOW (ref 250–450)
UIBC: 179 ug/dL (ref 131–425)

## 2022-08-06 LAB — HGB FRACTIONATION CASCADE
Hgb A2: 2.7 % (ref 1.8–3.2)
Hgb A: 97.3 % (ref 96.4–98.8)
Hgb F: 0 % (ref 0.0–2.0)
Hgb S: 0 %

## 2022-08-06 LAB — SPECIMEN STATUS REPORT

## 2022-11-05 ENCOUNTER — Ambulatory Visit: Payer: 59 | Attending: Family Medicine | Admitting: Family Medicine

## 2022-11-05 ENCOUNTER — Encounter: Payer: Self-pay | Admitting: Family Medicine

## 2022-11-05 VITALS — BP 153/80 | HR 60 | Ht 63.0 in | Wt 177.0 lb

## 2022-11-05 DIAGNOSIS — I10 Essential (primary) hypertension: Secondary | ICD-10-CM

## 2022-11-05 HISTORY — DX: Essential (primary) hypertension: I10

## 2022-11-05 MED ORDER — AMLODIPINE BESYLATE 5 MG PO TABS: 5.0000 mg | ORAL_TABLET | Freq: Every day | ORAL | 2 refills | Status: DC

## 2022-11-05 NOTE — Patient Instructions (Signed)

## 2022-11-05 NOTE — Progress Notes (Signed)
Subjective:  Patient ID: [name], female    DOB: 04-04-1971  Age: 52 y.o. MRN: [id number]  CC: Blood Pressure Check   HPI Yaa Donnellan is a 52 y.o. year old female presents today for an office visit.  Interval History:  At her last visit her blood pressure was elevated and the decision was made to work on lifestyle modifications and reassess at this visit but blood pressure is still elevated.  She does not eat out and cooks most of her food, states she does eat lunch meats.  She tries to exercise as much as she can. She is currently not on any blood pressure medication. Denies presence of additional concerns.  Past Medical History:  Diagnosis Date   Anemia    Fibrocystic breast    GERD (gastroesophageal reflux disease)    Heart murmur    Inclusion cyst of vulva    Labial abscess 2011   Primary hypertension 11/05/2022   Sinus disease    TMJ arthralgia 2012    Past Surgical History:  Procedure Laterality Date   KNEE SURGERY  10/22/1988   left   WISDOM TOOTH EXTRACTION  10/22/1989    Family History  Problem Relation Age of Onset   Breast cancer Mother 36   Cancer Mother    Lung cancer Father    Diabetes Father    Cancer Father        lung   Hypertension Father    Kidney disease Paternal Grandmother    Stroke Paternal Grandmother    Cancer Brother    Stroke Maternal Grandmother    Bladder Cancer Brother    Stroke Maternal Aunt    Colon cancer Neg Hx    Colon polyps Neg Hx    Esophageal cancer Neg Hx    Stomach cancer Neg Hx    Rectal cancer Neg Hx     Social History   Socioeconomic History   Marital status: Single    Spouse name: Not on file   Number of children: Not on file   Years of education: Not on file   Highest education level: Not on file  Occupational History   Not on file  Tobacco Use   Smoking status: Never   Smokeless tobacco: Never  Substance and Sexual Activity   Alcohol use: Yes    Alcohol/week: 3.0 standard drinks of alcohol     Types: 3 Standard drinks or equivalent per week    Comment: occasionally   Drug use: No   Sexual activity: Not Currently    Birth control/protection: Abstinence  Other Topics Concern   Not on file  Social History Narrative   Not on file   Social Determinants of Health   Financial Resource Strain: Not on file  Food Insecurity: Not on file  Transportation Needs: Not on file  Physical Activity: Not on file  Stress: Not on file  Social Connections: Not on file    Allergies  Allergen Reactions   Shellfish Allergy Hives and Swelling   Fish Allergy    Penicillins     Does not remember possibly when child   Tamiflu [Oseltamivir Phosphate] Rash    Outpatient Medications Prior to Visit  Medication Sig Dispense Refill   pantoprazole (PROTONIX) 40 MG tablet Take 1 tablet (40 mg total) by mouth daily. For 2 months. Take on empty stomach 20-30 minutes before breakfast. 60 tablet 0   Ascorbic Acid (VITAMIN C) 1000 MG tablet Vitamin C (Patient not taking: Reported on 08/01/2022)  clotrimazole (LOTRIMIN) 1 % cream Apply 1 Application topically 2 (two) times daily. (Patient not taking: Reported on 11/05/2022) 60 g 1   ELDERBERRY PO Elderberry (Patient not taking: Reported on 08/01/2022)     Multiple Vitamin (MULTIVITAMIN) tablet Take 1 tablet by mouth daily. Reported on 02/09/2016 (Patient not taking: Reported on 11/05/2022)     No facility-administered medications prior to visit.     ROS Review of Systems  Constitutional:  Negative for activity change and appetite change.  HENT:  Negative for sinus pressure and sore throat.   Respiratory:  Negative for chest tightness, shortness of breath and wheezing.   Cardiovascular:  Negative for chest pain and palpitations.  Gastrointestinal:  Negative for abdominal distention, abdominal pain and constipation.  Genitourinary: Negative.   Musculoskeletal: Negative.   Psychiatric/Behavioral:  Negative for behavioral problems and dysphoric mood.      Objective:  BP (!) 153/80   Pulse 60   Ht '5\' 3"'$  (1.6 m)   Wt 177 lb (80.3 kg)   SpO2 100%   BMI 31.35 kg/m      11/05/2022    3:26 PM 11/05/2022    3:10 PM 08/01/2022    3:55 PM  BP/Weight  Systolic BP 161 096 045  Diastolic BP 80 79 78  Wt. (Lbs)  177   BMI  31.35 kg/m2       Physical Exam Constitutional:      Appearance: She is well-developed.  Cardiovascular:     Rate and Rhythm: Normal rate.     Heart sounds: Normal heart sounds. No murmur heard. Pulmonary:     Effort: Pulmonary effort is normal.     Breath sounds: Normal breath sounds. No wheezing or rales.  Chest:     Chest wall: No tenderness.  Abdominal:     General: Bowel sounds are normal. There is no distension.     Palpations: Abdomen is soft. There is no mass.     Tenderness: There is no abdominal tenderness.  Musculoskeletal:        General: Normal range of motion.     Right lower leg: No edema.     Left lower leg: No edema.  Neurological:     Mental Status: She is alert and oriented to person, place, and time.  Psychiatric:        Mood and Affect: Mood normal.        Latest Ref Rng & Units 08/01/2022    3:58 PM 06/19/2021   11:49 AM 05/10/2020   11:06 AM  CMP  Glucose 70 - 99 mg/dL 113  93  91   BUN 6 - 24 mg/dL '9  9  14   '$ Creatinine 0.57 - 1.00 mg/dL 0.80  0.79  0.76   Sodium 134 - 144 mmol/L 141  139  137   Potassium 3.5 - 5.2 mmol/L 3.4  4.0  4.4   Chloride 96 - 106 mmol/L 106  105  103   CO2 20 - 29 mmol/L '24  23  29   '$ Calcium 8.7 - 10.2 mg/dL 9.1  9.5  9.5   Total Protein 6.0 - 8.5 g/dL 6.8  7.3  7.3   Total Bilirubin 0.0 - 1.2 mg/dL 0.2  0.3  0.3   Alkaline Phos 44 - 121 IU/L 57  60  55   AST 0 - 40 IU/L '18  19  16   '$ ALT 0 - 32 IU/L '11  15  18     '$ Lipid Panel  Component Value Date/Time   CHOL 178 06/19/2021 1149   TRIG 65 06/19/2021 1149   HDL 59 06/19/2021 1149   CHOLHDL 3.0 06/19/2021 1149   LDLCALC 107 (H) 06/19/2021 1149    CBC    Component Value Date/Time    WBC 3.7 08/01/2022 1558   WBC 4.7 05/10/2020 1106   RBC 5.08 08/01/2022 1558   RBC 5.21 (H) 05/10/2020 1106   HGB 11.7 08/01/2022 1558   HCT 37.9 08/01/2022 1558   PLT 304 08/01/2022 1558   MCV 75 (L) 08/01/2022 1558   MCH 23.0 (L) 08/01/2022 1558   MCH 23.8 (A) 02/09/2016 0855   MCH 22.7 (L) 10/31/2015 1256   MCHC 30.9 (L) 08/01/2022 1558   MCHC 31.9 05/10/2020 1106   RDW 12.9 08/01/2022 1558   LYMPHSABS 1.5 08/01/2022 1558   MONOABS 0.4 05/10/2020 1106   EOSABS 0.0 08/01/2022 1558   BASOSABS 0.0 08/01/2022 1558    Lab Results  Component Value Date   HGBA1C 5.4 08/01/2022    Assessment & Plan:  1. Primary hypertension New diagnosis Will initiate amlodipine Counseled on blood pressure goal of less than 130/80, low-sodium, DASH diet, medication compliance, 150 minutes of moderate intensity exercise per week. Discussed medication compliance, adverse effects. - amLODipine (NORVASC) 5 MG tablet; Take 1 tablet (5 mg total) by mouth daily.  Dispense: 30 tablet; Refill: 2 - LP+Non-HDL Cholesterol   Meds ordered this encounter  Medications   amLODipine (NORVASC) 5 MG tablet    Sig: Take 1 tablet (5 mg total) by mouth daily.    Dispense:  30 tablet    Refill:  2    Follow-up: Return in about 6 weeks (around 12/17/2022) for Blood Pressure follow-up.       Charlott Rakes, MD, FAAFP. [facility] and [facility] [place], [place]   11/05/2022, 5:23 PM

## 2022-11-06 ENCOUNTER — Encounter: Payer: Self-pay | Admitting: Family Medicine

## 2022-11-06 LAB — LP+NON-HDL CHOLESTEROL
Cholesterol, Total: 178 mg/dL (ref 100–199)
HDL: 64 mg/dL (ref >39)
LDL Chol Calc (NIH): 105 mg/dL — ABNORMAL HIGH (ref 0–99)
Total Non-HDL-Chol (LDL+VLDL): 114 mg/dL (ref 0–129)
Triglycerides: 44 mg/dL (ref 0–149)
VLDL Cholesterol Cal: 9 mg/dL (ref 5–40)

## 2022-11-15 DIAGNOSIS — R059 Cough, unspecified: Secondary | ICD-10-CM | POA: Diagnosis not present

## 2022-11-15 DIAGNOSIS — Z6831 Body mass index (BMI) 31.0-31.9, adult: Secondary | ICD-10-CM | POA: Diagnosis not present

## 2022-11-15 DIAGNOSIS — J00 Acute nasopharyngitis [common cold]: Secondary | ICD-10-CM | POA: Diagnosis not present

## 2022-11-15 DIAGNOSIS — I10 Essential (primary) hypertension: Secondary | ICD-10-CM | POA: Diagnosis not present

## 2023-01-01 ENCOUNTER — Encounter: Payer: Self-pay | Admitting: Family Medicine

## 2023-01-01 ENCOUNTER — Ambulatory Visit: Payer: 59 | Attending: Family Medicine | Admitting: Family Medicine

## 2023-01-01 DIAGNOSIS — I10 Essential (primary) hypertension: Secondary | ICD-10-CM

## 2023-01-01 MED ORDER — AMLODIPINE BESYLATE 5 MG PO TABS: 5.0000 mg | ORAL_TABLET | Freq: Every day | ORAL | 1 refills | Status: DC

## 2023-01-01 NOTE — Progress Notes (Signed)
Medication refills

## 2023-01-01 NOTE — Progress Notes (Signed)
Subjective:  Patient ID: Allison Farrell, female    DOB: [birth year]  Age: 52 y.o. MRN: [id number]  CC: Hypertension   HPI Allison Farrell is a 52 y.o. year old female with a history of hypertension here for an office visit.  Interval History: At her last visit amlodipine was initiated due to new diagnosis of hypertension.  She endorses adherence with her blood pressure and blood pressure is controlled today.  She is tolerating medication okay with no pedal edema.  She is also working on her diet and lifestyle modification. Denies presence of additional concerns. Past Medical History:  Diagnosis Date   Anemia    Fibrocystic breast    GERD (gastroesophageal reflux disease)    Heart murmur    Inclusion cyst of vulva    Labial abscess 2011   Primary hypertension 11/05/2022   Sinus disease    TMJ arthralgia 2012    Past Surgical History:  Procedure Laterality Date   KNEE SURGERY  10/22/1988   left   WISDOM TOOTH EXTRACTION  10/22/1989    Family History  Problem Relation Age of Onset   Breast cancer Mother 7   Cancer Mother    Lung cancer Father    Diabetes Father    Cancer Father        lung   Hypertension Father    Kidney disease Paternal Grandmother    Stroke Paternal Grandmother    Cancer Brother    Stroke Maternal Grandmother    Bladder Cancer Brother    Stroke Maternal Aunt    Colon cancer Neg Hx    Colon polyps Neg Hx    Esophageal cancer Neg Hx    Stomach cancer Neg Hx    Rectal cancer Neg Hx     Social History   Socioeconomic History   Marital status: Single    Spouse name: Not on file   Number of children: Not on file   Years of education: Not on file   Highest education level: Not on file  Occupational History   Not on file  Tobacco Use   Smoking status: Never   Smokeless tobacco: Never  Substance and Sexual Activity   Alcohol use: Yes    Alcohol/week: 3.0 standard drinks of alcohol    Types: 3 Standard drinks or equivalent per week    Comment:  occasionally   Drug use: No   Sexual activity: Not Currently    Birth control/protection: Abstinence  Other Topics Concern   Not on file  Social History Narrative   Not on file   Social Determinants of Health   Financial Resource Strain: Not on file  Food Insecurity: Not on file  Transportation Needs: Not on file  Physical Activity: Not on file  Stress: Not on file  Social Connections: Not on file    Allergies  Allergen Reactions   Shellfish Allergy Hives and Swelling   Fish Allergy    Penicillins     Does not remember possibly when child   Tamiflu [Oseltamivir Phosphate] Rash    Outpatient Medications Prior to Visit  Medication Sig Dispense Refill   amLODipine (NORVASC) 5 MG tablet Take 1 tablet (5 mg total) by mouth daily. 30 tablet 2   pantoprazole (PROTONIX) 40 MG tablet Take 1 tablet (40 mg total) by mouth daily. For 2 months. Take on empty stomach 20-30 minutes before breakfast. 60 tablet 0   No facility-administered medications prior to visit.     ROS Review of Systems  Constitutional:  Negative for activity change and appetite change.  HENT:  Negative for sinus pressure and sore throat.   Respiratory:  Negative for chest tightness, shortness of breath and wheezing.   Cardiovascular:  Negative for chest pain and palpitations.  Gastrointestinal:  Negative for abdominal distention, abdominal pain and constipation.  Genitourinary: Negative.   Musculoskeletal: Negative.   Psychiatric/Behavioral:  Negative for behavioral problems and dysphoric mood.     Objective:  BP 132/71   Pulse 68   Ht '5\' 3"'$  (1.6 m)   Wt 177 lb (80.3 kg)   SpO2 100%   BMI 31.35 kg/m      01/01/2023    9:54 AM 11/05/2022    3:26 PM 11/05/2022    3:10 PM  BP/Weight  Systolic BP [id number]  Diastolic BP 71 80 79  Wt. (Lbs) 177  177  BMI 31.35 kg/m2  31.35 kg/m2      Physical Exam Constitutional:      Appearance: She is well-developed.  Cardiovascular:     Rate and Rhythm:  Normal rate.     Heart sounds: Normal heart sounds. No murmur heard. Pulmonary:     Effort: Pulmonary effort is normal.     Breath sounds: Normal breath sounds. No wheezing or rales.  Chest:     Chest wall: No tenderness.  Abdominal:     General: Bowel sounds are normal. There is no distension.     Palpations: Abdomen is soft. There is no mass.     Tenderness: There is no abdominal tenderness.  Musculoskeletal:        General: Normal range of motion.     Right lower leg: No edema.     Left lower leg: No edema.  Neurological:     Mental Status: She is alert and oriented to person, place, and time.  Psychiatric:        Mood and Affect: Mood normal.        Latest Ref Rng & Units 08/01/2022    3:58 PM 06/19/2021   11:49 AM 05/10/2020   11:06 AM  CMP  Glucose 70 - 99 mg/dL 113  93  91   BUN 6 - 24 mg/dL '9  9  14   '$ Creatinine 0.57 - 1.00 mg/dL 0.80  0.79  0.76   Sodium 134 - 144 mmol/L 141  139  137   Potassium 3.5 - 5.2 mmol/L 3.4  4.0  4.4   Chloride 96 - 106 mmol/L 106  105  103   CO2 20 - 29 mmol/L '24  23  29   '$ Calcium 8.7 - 10.2 mg/dL 9.1  9.5  9.5   Total Protein 6.0 - 8.5 g/dL 6.8  7.3  7.3   Total Bilirubin 0.0 - 1.2 mg/dL 0.2  0.3  0.3   Alkaline Phos 44 - 121 IU/L 57  60  55   AST 0 - 40 IU/L '18  19  16   '$ ALT 0 - 32 IU/L '11  15  18     '$ Lipid Panel     Component Value Date/Time   CHOL 178 11/05/2022 1549   TRIG 44 11/05/2022 1549   HDL 64 11/05/2022 1549   CHOLHDL 3.0 06/19/2021 1149   LDLCALC 105 (H) 11/05/2022 1549    CBC    Component Value Date/Time   WBC 3.7 08/01/2022 1558   WBC 4.7 05/10/2020 1106   RBC 5.08 08/01/2022 1558   RBC 5.21 (H) 05/10/2020 1106   HGB 11.7  08/01/2022 1558   HCT 37.9 08/01/2022 1558   PLT 304 08/01/2022 1558   MCV 75 (L) 08/01/2022 1558   MCH 23.0 (L) 08/01/2022 1558   MCH 23.8 (A) 02/09/2016 0855   MCH 22.7 (L) 10/31/2015 1256   MCHC 30.9 (L) 08/01/2022 1558   MCHC 31.9 05/10/2020 1106   RDW 12.9 08/01/2022 1558    LYMPHSABS 1.5 08/01/2022 1558   MONOABS 0.4 05/10/2020 1106   EOSABS 0.0 08/01/2022 1558   BASOSABS 0.0 08/01/2022 1558    Lab Results  Component Value Date   HGBA1C 5.4 08/01/2022    Assessment & Plan:  1. Primary hypertension Controlled Continue amlodipine Counseled on blood pressure goal of less than 130/80, low-sodium, DASH diet, medication compliance, 150 minutes of moderate intensity exercise per week. Discussed medication compliance, adverse effects. - amLODipine (NORVASC) 5 MG tablet; Take 1 tablet (5 mg total) by mouth daily.  Dispense: 90 tablet; Refill: 1    Meds ordered this encounter  Medications   amLODipine (NORVASC) 5 MG tablet    Sig: Take 1 tablet (5 mg total) by mouth daily.    Dispense:  90 tablet    Refill:  1    Follow-up: Return in about 6 months (around 07/04/2023) for Chronic medical conditions.       Charlott Rakes, MD, FAAFP. [facility] and [facility] [place], [place]   01/01/2023, 11:22 AM

## 2023-01-01 NOTE — Patient Instructions (Signed)
Managing Your Hypertension Hypertension, also called high blood pressure, is when the force of the blood pressing against the walls of the arteries is too strong. Arteries are blood vessels that carry blood from your heart throughout your body. Hypertension forces the heart to work harder to pump blood and may cause the arteries to become narrow or stiff. Understanding blood pressure readings A blood pressure reading includes a higher number over a lower number: The first, or top, number is called the systolic pressure. It is a measure of the pressure in your arteries as your heart beats. The second, or bottom number, is called the diastolic pressure. It is a measure of the pressure in your arteries as the heart relaxes. For most people, a normal blood pressure is below 120/80. Your personal target blood pressure may vary depending on your medical conditions, your age, and other factors. Blood pressure is classified into four stages. Based on your blood pressure reading, your health care provider may use the following stages to determine what type of treatment you need, if any. Systolic pressure and diastolic pressure are measured in a unit called millimeters of mercury (mmHg). Normal Systolic pressure: below 120. Diastolic pressure: below 80. Elevated Systolic pressure: 120-129. Diastolic pressure: below 80. Hypertension stage 1 Systolic pressure: 130-139. Diastolic pressure: 80-89. Hypertension stage 2 Systolic pressure: 140 or above. Diastolic pressure: 90 or above. How can this condition affect me? Managing your hypertension is very important. Over time, hypertension can damage the arteries and decrease blood flow to parts of the body, including the brain, heart, and kidneys. Having untreated or uncontrolled hypertension can lead to: A heart attack. A stroke. A weakened blood vessel (aneurysm). Heart failure. Kidney damage. Eye damage. Memory and concentration problems. Vascular  dementia. What actions can I take to manage this condition? Hypertension can be managed by making lifestyle changes and possibly by taking medicines. Your health care provider will help you make a plan to bring your blood pressure within a normal range. You may be referred for counseling on a healthy diet and physical activity. Nutrition  Eat a diet that is high in fiber and potassium, and low in salt (sodium), added sugar, and fat. An example eating plan is called the DASH diet. DASH stands for Dietary Approaches to Stop Hypertension. To eat this way: Eat plenty of fresh fruits and vegetables. Try to fill one-half of your plate at each meal with fruits and vegetables. Eat whole grains, such as whole-wheat pasta, brown rice, or whole-grain bread. Fill about one-fourth of your plate with whole grains. Eat low-fat dairy products. Avoid fatty cuts of meat, processed or cured meats, and poultry with skin. Fill about one-fourth of your plate with lean proteins such as fish, chicken without skin, beans, eggs, and tofu. Avoid pre-made and processed foods. These tend to be higher in sodium, added sugar, and fat. Reduce your daily sodium intake. Many people with hypertension should eat less than 1,500 mg of sodium a day. Lifestyle  Work with your health care provider to maintain a healthy body weight or to lose weight. Ask what an ideal weight is for you. Get at least 30 minutes of exercise that causes your heart to beat faster (aerobic exercise) most days of the week. Activities may include walking, swimming, or biking. Include exercise to strengthen your muscles (resistance exercise), such as weight lifting, as part of your weekly exercise routine. Try to do these types of exercises for 30 minutes at least 3 days a week. Do   not use any products that contain nicotine or tobacco. These products include cigarettes, chewing tobacco, and vaping devices, such as e-cigarettes. If you need help quitting, ask your  health care provider. Control any long-term (chronic) conditions you have, such as high cholesterol or diabetes. Identify your sources of stress and find ways to manage stress. This may include meditation, deep breathing, or making time for fun activities. Alcohol use Do not drink alcohol if: Your health care provider tells you not to drink. You are pregnant, may be pregnant, or are planning to become pregnant. If you drink alcohol: Limit how much you have to: 0-1 drink a day for women. 0-2 drinks a day for men. Know how much alcohol is in your drink. In the [place] one drink equals one 12 oz bottle of beer (355 mL), one 5 oz glass of wine (148 mL), or one 1 oz glass of hard liquor (44 mL). Medicines Your health care provider may prescribe medicine if lifestyle changes are not enough to get your blood pressure under control and if: Your systolic blood pressure is 130 or higher. Your diastolic blood pressure is 80 or higher. Take medicines only as told by your health care provider. Follow the directions carefully. Blood pressure medicines must be taken as told by your health care provider. The medicine does not work as well when you skip doses. Skipping doses also puts you at risk for problems. Monitoring Before you monitor your blood pressure: Do not smoke, drink caffeinated beverages, or exercise within 30 minutes before taking a measurement. Use the bathroom and empty your bladder (urinate). Sit quietly for at least 5 minutes before taking measurements. Monitor your blood pressure at home as told by your health care provider. To do this: Sit with your back straight and supported. Place your feet flat on the floor. Do not cross your legs. Support your arm on a flat surface, such as a table. Make sure your upper arm is at heart level. Each time you measure, take two or three readings one minute apart and record the results. You may also need to have your blood pressure checked regularly by  your health care provider. General information Talk with your health care provider about your diet, exercise habits, and other lifestyle factors that may be contributing to hypertension. Review all the medicines you take with your health care provider because there may be side effects or interactions. Keep all follow-up visits. Your health care provider can help you create and adjust your plan for managing your high blood pressure. Where to find more information National Heart, Lung, and Blood Institute: www.nhlbi.nih.gov American Heart Association: www.heart.org Contact a health care provider if: You think you are having a reaction to medicines you have taken. You have repeated (recurrent) headaches. You feel dizzy. You have swelling in your ankles. You have trouble with your vision. Get help right away if: You develop a severe headache or confusion. You have unusual weakness or numbness, or you feel faint. You have severe pain in your chest or abdomen. You vomit repeatedly. You have trouble breathing. These symptoms may be an emergency. Get help right away. Call 911. Do not wait to see if the symptoms will go away. Do not drive yourself to the hospital. Summary Hypertension is when the force of blood pumping through your arteries is too strong. If this condition is not controlled, it may put you at risk for serious complications. Your personal target blood pressure may vary depending on your medical conditions,   your age, and other factors. For most people, a normal blood pressure is less than 120/80. Hypertension is managed by lifestyle changes, medicines, or both. Lifestyle changes to help manage hypertension include losing weight, eating a healthy, low-sodium diet, exercising more, stopping smoking, and limiting alcohol. This information is not intended to replace advice given to you by your health care provider. Make sure you discuss any questions you have with your health care  provider. Document Revised: 06/22/2021 Document Reviewed: 06/22/2021 Elsevier Patient Education  2023 [employer]  

## 2023-01-04 DIAGNOSIS — Z1231 Encounter for screening mammogram for malignant neoplasm of breast: Secondary | ICD-10-CM | POA: Diagnosis not present

## 2023-01-04 DIAGNOSIS — Z1211 Encounter for screening for malignant neoplasm of colon: Secondary | ICD-10-CM | POA: Diagnosis not present

## 2023-01-04 DIAGNOSIS — Z6831 Body mass index (BMI) 31.0-31.9, adult: Secondary | ICD-10-CM | POA: Diagnosis not present

## 2023-01-04 DIAGNOSIS — Z01419 Encounter for gynecological examination (general) (routine) without abnormal findings: Secondary | ICD-10-CM | POA: Diagnosis not present

## 2023-01-04 DIAGNOSIS — N952 Postmenopausal atrophic vaginitis: Secondary | ICD-10-CM | POA: Diagnosis not present

## 2023-01-04 DIAGNOSIS — Z124 Encounter for screening for malignant neoplasm of cervix: Secondary | ICD-10-CM | POA: Diagnosis not present

## 2023-01-04 LAB — HM MAMMOGRAPHY

## 2023-01-09 LAB — HM PAP SMEAR

## 2023-02-25 DIAGNOSIS — I10 Essential (primary) hypertension: Secondary | ICD-10-CM | POA: Diagnosis not present

## 2023-02-25 DIAGNOSIS — L239 Allergic contact dermatitis, unspecified cause: Secondary | ICD-10-CM | POA: Diagnosis not present

## 2023-02-25 DIAGNOSIS — Z681 Body mass index (BMI) 19 or less, adult: Secondary | ICD-10-CM | POA: Diagnosis not present

## 2023-07-04 ENCOUNTER — Ambulatory Visit: Payer: 59 | Admitting: Family Medicine

## 2023-07-30 ENCOUNTER — Other Ambulatory Visit: Payer: Self-pay | Admitting: Family Medicine

## 2023-07-30 DIAGNOSIS — I10 Essential (primary) hypertension: Secondary | ICD-10-CM

## 2023-08-06 ENCOUNTER — Encounter: Payer: Self-pay | Admitting: Family Medicine

## 2023-08-06 ENCOUNTER — Ambulatory Visit: Payer: 59 | Attending: Family Medicine | Admitting: Family Medicine

## 2023-08-06 VITALS — BP 145/77 | HR 63 | Ht 63.0 in | Wt 177.6 lb

## 2023-08-06 DIAGNOSIS — B353 Tinea pedis: Secondary | ICD-10-CM | POA: Diagnosis not present

## 2023-08-06 DIAGNOSIS — Z0001 Encounter for general adult medical examination with abnormal findings: Secondary | ICD-10-CM | POA: Diagnosis not present

## 2023-08-06 DIAGNOSIS — Z131 Encounter for screening for diabetes mellitus: Secondary | ICD-10-CM

## 2023-08-06 DIAGNOSIS — I10 Essential (primary) hypertension: Secondary | ICD-10-CM

## 2023-08-06 MED ORDER — CLOTRIMAZOLE 1 % EX CREA: 1.0000 | TOPICAL_CREAM | Freq: Two times a day (BID) | CUTANEOUS | 1 refills | Status: AC

## 2023-08-06 MED ORDER — AMLODIPINE BESYLATE 5 MG PO TABS
5.0000 mg | ORAL_TABLET | Freq: Every day | ORAL | 1 refills | Status: DC
Start: 2023-08-06 — End: 2024-03-03

## 2023-08-06 NOTE — Progress Notes (Unsigned)
Subjective:  Patient ID: Allison Farrell, female    DOB: 1971/07/02  Age: 52 y.o. MRN: [id number]  CC: Annual Exam (Pap done 12/2022)   HPI Nattaly Yebra is a 52 y.o. year old female with a history of Hypretension.  Interval History: Discussed the use of AI scribe software for clinical note transcription with the patient, who gave verbal consent to proceed.  She presents for her annual check-up. She expresses concerns about recommended vaccines, specifically the flu shot and shingles vaccine. She has never received a flu shot and is unsure about the necessity of the shingles vaccine.  She had her Pap smear and mammogram with her GYN.  Additionally, the patient reports a recurring issue with foot irritation, pruritic previously treated as athlete's foot. Despite maintaining good foot hygiene and regularly changing socks, the irritation persists. The patient notes that the irritation is not severe or excessively itchy, but is frustrated by its recurrence. The irritation was last experienced towards the end of last month.  She did receive a topical antifungal cream which she used alternating with tea tree oil and Lamisil and is unsure of what worked and what was effective.She has recently left a job at [employer]   The patient is currently taking amlodipine 5mg  and has no other known medical conditions.       Past Medical History:  Diagnosis Date   Anemia    Fibrocystic breast    GERD (gastroesophageal reflux disease)    Heart murmur    Inclusion cyst of vulva    Labial abscess 2011   Primary hypertension 11/05/2022   Sinus disease    TMJ arthralgia 2012    Past Surgical History:  Procedure Laterality Date   KNEE SURGERY  10/22/1988   left   WISDOM TOOTH EXTRACTION  10/22/1989    Family History  Problem Relation Age of Onset   Breast cancer Mother 77   Cancer Mother    Lung cancer Father    Diabetes Father    Cancer Father        lung   Hypertension Father    Kidney disease  Paternal Grandmother    Stroke Paternal Grandmother    Cancer Brother    Stroke Maternal Grandmother    Bladder Cancer Brother    Stroke Maternal Aunt    Colon cancer Neg Hx    Colon polyps Neg Hx    Esophageal cancer Neg Hx    Stomach cancer Neg Hx    Rectal cancer Neg Hx     Social History   Socioeconomic History   Marital status: Single    Spouse name: Not on file   Number of children: Not on file   Years of education: Not on file   Highest education level: Not on file  Occupational History   Not on file  Tobacco Use   Smoking status: Never   Smokeless tobacco: Never  Substance and Sexual Activity   Alcohol use: Yes    Alcohol/week: 3.0 standard drinks of alcohol    Types: 3 Standard drinks or equivalent per week    Comment: occasionally   Drug use: No   Sexual activity: Not Currently    Birth control/protection: Abstinence  Other Topics Concern   Not on file  Social History Narrative   Not on file   Social Determinants of Health   Financial Resource Strain: Low Risk  (08/06/2023)   Overall Financial Resource Strain (CARDIA)    Difficulty of Paying Living Expenses: Not very  hard  Food Insecurity: Food Insecurity Present (08/06/2023)   Hunger Vital Sign    Worried About Running Out of Food in the Last Year: Never true    Ran Out of Food in the Last Year: Often true  Transportation Needs: No Transportation Needs (08/06/2023)   PRAPARE - Administrator, Civil Service (Medical): No    Lack of Transportation (Non-Medical): No  Physical Activity: Insufficiently Active (08/06/2023)   Exercise Vital Sign    Days of Exercise per Week: 3 days    Minutes of Exercise per Session: 30 min  Stress: No Stress Concern Present (08/06/2023)   [employer] of Occupational Health - Occupational Stress Questionnaire    Feeling of Stress : Not at all  Social Connections: Moderately Isolated (08/06/2023)   Social Connection and Isolation Panel [NHANES]     Frequency of Communication with Friends and Family: More than three times a week    Frequency of Social Gatherings with Friends and Family: Once a week    Attends Religious Services: Never    Database administrator or Organizations: Yes    Attends Engineer, structural: Patient declined    Marital Status: Never married    Allergies  Allergen Reactions   Shellfish Allergy Hives and Swelling   Fish Allergy    Penicillins     Does not remember possibly when child   Tamiflu [Oseltamivir Phosphate] Rash    Outpatient Medications Prior to Visit  Medication Sig Dispense Refill   amLODipine (NORVASC) 5 MG tablet TAKE 1 TABLET BY MOUTH DAILY 30 tablet 0   pantoprazole (PROTONIX) 40 MG tablet Take 1 tablet (40 mg total) by mouth daily. For 2 months. Take on empty stomach 20-30 minutes before breakfast. 60 tablet 0   No facility-administered medications prior to visit.     ROS Review of Systems  Constitutional:  Negative for activity change, appetite change and fatigue.  HENT:  Negative for congestion, sinus pressure and sore throat.   Eyes:  Negative for visual disturbance.  Respiratory:  Negative for cough, chest tightness, shortness of breath and wheezing.   Cardiovascular:  Negative for chest pain and palpitations.  Gastrointestinal:  Negative for abdominal distention, abdominal pain and constipation.  Endocrine: Negative for polydipsia.  Genitourinary:  Negative for dysuria and frequency.  Musculoskeletal:  Negative for arthralgias and back pain.  Skin:  Negative for rash.  Neurological:  Negative for tremors, light-headedness and numbness.  Hematological:  Does not bruise/bleed easily.  Psychiatric/Behavioral:  Negative for agitation and behavioral problems.     Objective:  BP (!) 145/77   Pulse 63   Ht 5\' 3"  (1.6 m)   Wt 177 lb 9.6 oz (80.6 kg)   SpO2 100%   BMI 31.46 kg/m      08/06/2023    3:52 PM 08/06/2023    3:16 PM 01/01/2023    9:54 AM  BP/Weight   Systolic BP 145 155 132  Diastolic BP 77 79 71  Wt. (Lbs)  177.6 177  BMI  31.46 kg/m2 31.35 kg/m2      Physical Exam Constitutional:      General: She is not in acute distress.    Appearance: She is well-developed. She is not diaphoretic.  HENT:     Head: Normocephalic.     Right Ear: External ear normal.     Left Ear: External ear normal.     Nose: Nose normal.     Mouth/Throat:     Mouth:  Mucous membranes are moist.  Eyes:     Extraocular Movements: Extraocular movements intact.     Conjunctiva/sclera: Conjunctivae normal.     Pupils: Pupils are equal, round, and reactive to light.  Neck:     Vascular: No JVD.  Cardiovascular:     Rate and Rhythm: Normal rate and regular rhythm.     Pulses: Normal pulses.     Heart sounds: Normal heart sounds. No murmur heard.    No gallop.  Pulmonary:     Effort: Pulmonary effort is normal. No respiratory distress.     Breath sounds: Normal breath sounds. No wheezing or rales.  Chest:     Chest wall: No tenderness.  Abdominal:     General: Bowel sounds are normal. There is no distension.     Palpations: Abdomen is soft. There is no mass.     Tenderness: There is no abdominal tenderness.  Musculoskeletal:        General: No tenderness. Normal range of motion.     Cervical back: Normal range of motion.  Skin:    General: Skin is warm and dry.     Comments: Minimal dryness of skin on periphery of lateral aspect of left sole  Neurological:     Mental Status: She is alert and oriented to person, place, and time.     Deep Tendon Reflexes: Reflexes are normal and symmetric.  Psychiatric:        Mood and Affect: Mood normal.        Latest Ref Rng & Units 08/06/2023    4:03 PM 08/01/2022    3:58 PM 06/19/2021   11:49 AM  CMP  Glucose 70 - 99 mg/dL 82  952  93   BUN 6 - 24 mg/dL 10  9  9    Creatinine 0.57 - 1.00 mg/dL 8.41  3.24  4.01   Sodium 134 - 144 mmol/L 140  141  139   Potassium 3.5 - 5.2 mmol/L 4.2  3.4  4.0    Chloride 96 - 106 mmol/L 105  106  105   CO2 20 - 29 mmol/L 21  24  23    Calcium 8.7 - 10.2 mg/dL 9.2  9.1  9.5   Total Protein 6.0 - 8.5 g/dL 7.1  6.8  7.3   Total Bilirubin 0.0 - 1.2 mg/dL 0.5  0.2  0.3   Alkaline Phos 44 - 121 IU/L 57  57  60   AST 0 - 40 IU/L 17  18  19    ALT 0 - 32 IU/L 9  11  15      Lipid Panel     Component Value Date/Time   CHOL 185 08/06/2023 1603   TRIG 44 08/06/2023 1603   HDL 70 08/06/2023 1603   CHOLHDL 3.0 06/19/2021 1149   LDLCALC 106 (H) 08/06/2023 1603    CBC    Component Value Date/Time   WBC 5.0 08/06/2023 1603   WBC 4.7 05/10/2020 1106   RBC 5.02 08/06/2023 1603   RBC 5.21 (H) 05/10/2020 1106   HGB 11.9 08/06/2023 1603   HCT 38.1 08/06/2023 1603   PLT 281 08/06/2023 1603   MCV 76 (L) 08/06/2023 1603   MCH 23.7 (L) 08/06/2023 1603   MCH 23.8 (A) 02/09/2016 0855   MCH 22.7 (L) 10/31/2015 1256   MCHC 31.2 (L) 08/06/2023 1603   MCHC 31.9 05/10/2020 1106   RDW 12.7 08/06/2023 1603   LYMPHSABS 2.2 08/06/2023 1603   MONOABS 0.4 05/10/2020 1106  EOSABS 0.1 08/06/2023 1603   BASOSABS 0.0 08/06/2023 1603    Lab Results  Component Value Date   HGBA1C 5.5 08/06/2023    Assessment & Plan:  Annual visit for general adult medical examination with abnormal findings Up-to-date on Pap smear and mammogram Continue routine healthcare maintenance Counseled on importance of flu shot and shingles    Recurrent Tinea Pedis Despite good foot hygiene, patient reports recurrent foot irritation, itching, and peeling. Unclear etiology of recurrence. -Refer to podiatry for further evaluation and management. -Prescribe topical antifungal cream.  Hypertension Patient currently on Amlodipine 5mg  daily. Blood pressure slightly elevated at visit -Continue Amlodipine 5mg  daily. -Will reassess blood pressure at next visit  General Health Maintenance -Order labs to check cholesterol and screen for diabetes. -Update healthcare maintenance to reflect  recent mammogram and Pap smear. -Check blood pressure at next visit.          Meds ordered this encounter  Medications   clotrimazole (LOTRIMIN) 1 % cream    Sig: Apply 1 Application topically 2 (two) times daily.    Dispense:  45 g    Refill:  1   amLODipine (NORVASC) 5 MG tablet    Sig: Take 1 tablet (5 mg total) by mouth daily.    Dispense:  90 tablet    Refill:  1    Follow-up: Return in about 6 months (around 02/04/2024) for Blood Pressure follow-up with PCP.       Hoy Register, MD, FAAFP. [facility] [place], [place] [phone]   08/07/2023, 12:36 PM

## 2023-08-06 NOTE — Patient Instructions (Signed)
Athlete's Foot Athlete's foot (tinea pedis) is a fungal infection of the skin on your feet. It often occurs on the skin that is between or underneath the toes. It can also occur on the soles of your feet. The infection can spread from person to person (is contagious). It can also spread when a person's bare feet come in contact with the fungus on shower floors or on items such as shoes. What are the causes? This condition is caused by a fungus that grows in warm, moist places. You can get athlete's foot by sharing shoes, shower stalls, towels, and wet floors with someone who is infected. Not washing your feet or changing your socks often enough can also lead to athlete's foot. What increases the risk? This condition is more likely to develop in: Men. People who have a weak body defense system (immune system). People who have diabetes. People who use public showers, such as at a gym. People who wear heavy-duty shoes, such as Youth worker. Seasons with warm, humid weather. What are the signs or symptoms? Symptoms of this condition include: Itchy areas between your toes or on the soles of your feet. White, flaky, or scaly areas between your toes or on the soles of your feet. Very itchy small blisters between your toes or on the soles of your feet. Small cuts in your skin. These cuts can become infected. Thick or discolored toenails. How is this diagnosed? This condition may be diagnosed with a physical exam and a review of your medical history. Your health care provider may also take a skin or toenail sample to examine under a microscope. How is this treated? This condition is treated with antifungal medicines. These may be applied as powders, ointments, or creams. In severe cases, an oral antifungal medicine may be given. Follow these instructions at home: Medicines Apply or take over-the-counter and prescription medicines only as told by your health care provider. Apply your  antifungal medicine as told by your health care provider. Do not stop using the antifungal even if your condition improves. Foot care Do not scratch your feet. Keep your feet dry: Wear cotton or wool socks. Change your socks every day or if they become wet. Wear shoes that allow air to flow, such as sandals or canvas tennis shoes. Wash and dry your feet, including the area between your toes. Also, wash and dry your feet: Every day or as told by your health care provider. After exercising. General instructions Do not let others use towels, shoes, nail clippers, or other personal items that touch your feet. Protect your feet by wearing sandals in wet areas, such as locker rooms and shared showers. Keep all follow-up visits. This is important. If you have diabetes, keep your blood sugar under control. Contact a health care provider if: You have a fever. You have swelling, soreness, warmth, or redness in your foot. Your feet are not getting better with treatment. Your symptoms get worse. You have new symptoms. You have severe pain. Summary Athlete's foot (tinea pedis) is a fungal infection of the skin on your feet. It often occurs on skin that is between or underneath the toes. This condition is caused by a fungus that grows in warm, moist places. Symptoms include white, flaky, or scaly areas between your toes or on the soles of your feet. This condition is treated with antifungal medicines. Keep your feet clean. Always dry them thoroughly. This information is not intended to replace advice given to you by  your health care provider. Make sure you discuss any questions you have with your health care provider. Document Revised: 01/29/2021 Document Reviewed: 01/29/2021 Elsevier Patient Education  2024 [employer].

## 2023-08-07 LAB — CBC WITH DIFFERENTIAL/PLATELET
Basophils Absolute: 0 10*3/uL (ref 0.0–0.2)
Basos: 1 %
EOS (ABSOLUTE): 0.1 10*3/uL (ref 0.0–0.4)
Eos: 1 %
Hematocrit: 38.1 % (ref 34.0–46.6)
Hemoglobin: 11.9 g/dL (ref 11.1–15.9)
Immature Grans (Abs): 0 10*3/uL (ref 0.0–0.1)
Immature Granulocytes: 0 %
Lymphocytes Absolute: 2.2 10*3/uL (ref 0.7–3.1)
Lymphs: 43 %
MCH: 23.7 pg — ABNORMAL LOW (ref 26.6–33.0)
MCHC: 31.2 g/dL — ABNORMAL LOW (ref 31.5–35.7)
MCV: 76 fL — ABNORMAL LOW (ref 79–97)
Monocytes Absolute: 0.4 10*3/uL (ref 0.1–0.9)
Monocytes: 7 %
Neutrophils Absolute: 2.4 10*3/uL (ref 1.4–7.0)
Neutrophils: 48 %
Platelets: 281 10*3/uL (ref 150–450)
RBC: 5.02 x10E6/uL (ref 3.77–5.28)
RDW: 12.7 % (ref 11.7–15.4)
WBC: 5 10*3/uL (ref 3.4–10.8)

## 2023-08-07 LAB — CMP14+EGFR
ALT: 9 [IU]/L (ref 0–32)
AST: 17 [IU]/L (ref 0–40)
Albumin: 4.2 g/dL (ref 3.9–4.9)
Alkaline Phosphatase: 57 [IU]/L (ref 44–121)
BUN/Creatinine Ratio: 14 (ref 9–23)
BUN: 10 mg/dL (ref 6–24)
Bilirubin Total: 0.5 mg/dL (ref 0.0–1.2)
CO2: 21 mmol/L (ref 20–29)
Calcium: 9.2 mg/dL (ref 8.7–10.2)
Chloride: 105 mmol/L (ref 96–106)
Creatinine, Ser: 0.71 mg/dL (ref 0.57–1.00)
Globulin, Total: 2.9 g/dL (ref 1.5–4.5)
Glucose: 82 mg/dL (ref 70–99)
Potassium: 4.2 mmol/L (ref 3.5–5.2)
Sodium: 140 mmol/L (ref 134–144)
Total Protein: 7.1 g/dL (ref 6.0–8.5)
eGFR: 104 mL/min/{1.73_m2} (ref >59)

## 2023-08-07 LAB — LP+NON-HDL CHOLESTEROL
Cholesterol, Total: 185 mg/dL (ref 100–199)
HDL: 70 mg/dL (ref >39)
LDL Chol Calc (NIH): 106 mg/dL — ABNORMAL HIGH (ref 0–99)
Total Non-HDL-Chol (LDL+VLDL): 115 mg/dL (ref 0–129)
Triglycerides: 44 mg/dL (ref 0–149)
VLDL Cholesterol Cal: 9 mg/dL (ref 5–40)

## 2023-08-07 LAB — HEMOGLOBIN A1C
Est. average glucose Bld gHb Est-mCnc: 111 mg/dL
Hgb A1c MFr Bld: 5.5 % (ref 4.8–5.6)

## 2023-08-21 ENCOUNTER — Ambulatory Visit: Payer: 59 | Admitting: Podiatry

## 2023-08-26 ENCOUNTER — Ambulatory Visit: Payer: 59 | Admitting: Family Medicine

## 2023-08-28 ENCOUNTER — Ambulatory Visit: Payer: 59 | Admitting: Family Medicine

## 2023-08-29 ENCOUNTER — Ambulatory Visit: Payer: BC Managed Care – PPO | Admitting: Podiatry

## 2023-08-29 ENCOUNTER — Encounter: Payer: Self-pay | Admitting: Podiatry

## 2023-08-29 VITALS — Ht 63.0 in | Wt 177.6 lb

## 2023-08-29 DIAGNOSIS — B351 Tinea unguium: Secondary | ICD-10-CM | POA: Diagnosis not present

## 2023-08-29 DIAGNOSIS — L309 Dermatitis, unspecified: Secondary | ICD-10-CM | POA: Diagnosis not present

## 2023-08-29 MED ORDER — TERBINAFINE HCL 250 MG PO TABS: 250.0000 mg | ORAL_TABLET | Freq: Every day | ORAL | 0 refills | Status: DC

## 2023-08-29 NOTE — Progress Notes (Signed)
Subjective:   Patient ID: Allison Farrell, female   DOB: 52 y.o.   MRN: [id number]   HPI Patient presents with significant dryness of the left foot with itching stating it occurs every 1 to 2 years around this time a year.  Patient does not smoke likes to be active been present for several months   Review of Systems  All other systems reviewed and are negative.       Objective:  Physical Exam Vitals and nursing note reviewed.  Constitutional:      Appearance: She is well-developed.  Pulmonary:     Effort: Pulmonary effort is normal.  Musculoskeletal:        General: Normal range of motion.  Skin:    General: Skin is warm.  Neurological:     Mental Status: She is alert.     Neurovascular status intact muscle strength adequate range of motion adequate with patient found to have irritation plantar skin lateral aspect left foot and plantar aspect left foot that is itchy no blisters are noted currently no open tissue formation.  Also has discoloration of several nailbeds bilateral     Assessment:  Probability for fungal infection of skin bilateral along with nail disease that is localized and appears to be more trauma than fungus     Plan:  H&P reviewed and I am going to place him on 30 days of oral antifungal therapy 1 a da and I am going to have her start Lotrimin cream.  Do not recommend treatment for nails currently unless they get worse and they also do not hurt

## 2023-09-04 ENCOUNTER — Ambulatory Visit: Payer: 59 | Admitting: Family Medicine

## 2023-12-10 ENCOUNTER — Other Ambulatory Visit: Payer: Self-pay | Admitting: Obstetrics and Gynecology

## 2023-12-10 DIAGNOSIS — Z1231 Encounter for screening mammogram for malignant neoplasm of breast: Secondary | ICD-10-CM

## 2024-01-01 ENCOUNTER — Other Ambulatory Visit: Payer: Self-pay | Admitting: Family Medicine

## 2024-01-01 DIAGNOSIS — I10 Essential (primary) hypertension: Secondary | ICD-10-CM

## 2024-01-02 NOTE — Telephone Encounter (Signed)
 Requested Prescriptions  Refused Prescriptions Disp Refills   amLODipine (NORVASC) 5 MG tablet [Pharmacy Med Name: amLODIPine BESYLATE 5 MG TAB] 90 tablet 1    Sig: TAKE 1 TABLET BY MOUTH DAILY     Cardiovascular: Calcium Channel Blockers 2 Failed - 01/02/2024 11:28 AM      Failed - Last BP in normal range    BP Readings from Last 1 Encounters:  08/06/23 (!) 145/77         Passed - Last Heart Rate in normal range    Pulse Readings from Last 1 Encounters:  08/06/23 63         Passed - Valid encounter within last 6 months    Recent Outpatient Visits           4 months ago Annual visit for general adult medical examination with abnormal findings   [facility] [place]. [facility] Hoy Register, MD   1 year ago Primary hypertension   [facility] [place]. [facility] Hoy Register, MD   1 year ago Primary hypertension   [facility] [place] [facility] [place]. [facility] Hoy Register, MD   1 year ago Annual physical exam   [facility] [place] [facility] [place]. [facility] Hoy Register, MD   2 years ago Annual physical exam   [facility] [place] [facility] Of [place]. [facility] Hoy Register, MD       Future Appointments             In 1 month Hoy Register, MD [facility] [place] - A Dept Of [place]. [facility]

## 2024-01-06 ENCOUNTER — Ambulatory Visit
Admission: RE | Admit: 2024-01-06 | Discharge: 2024-01-06 | Disposition: A | Discharge status: Home / Self Care | Payer: 59 | Source: Ambulatory Visit | Attending: Obstetrics and Gynecology | Admitting: Obstetrics and Gynecology

## 2024-01-06 DIAGNOSIS — Z1231 Encounter for screening mammogram for malignant neoplasm of breast: Secondary | ICD-10-CM

## 2024-01-08 ENCOUNTER — Encounter: Payer: Self-pay | Admitting: Obstetrics and Gynecology

## 2024-01-08 DIAGNOSIS — Z1231 Encounter for screening mammogram for malignant neoplasm of breast: Secondary | ICD-10-CM

## 2024-02-04 ENCOUNTER — Ambulatory Visit: Payer: 59 | Admitting: Family Medicine

## 2024-02-25 ENCOUNTER — Ambulatory Visit: Payer: Self-pay | Admitting: Podiatry

## 2024-02-25 DIAGNOSIS — B351 Tinea unguium: Secondary | ICD-10-CM

## 2024-02-25 DIAGNOSIS — B353 Tinea pedis: Secondary | ICD-10-CM | POA: Diagnosis not present

## 2024-02-25 MED ORDER — CICLOPIROX OLAMINE 0.77 % EX CREA: TOPICAL_CREAM | CUTANEOUS | 1 refills | Status: AC

## 2024-02-25 NOTE — Progress Notes (Unsigned)
 Chief Complaint  Patient presents with   Nail Problem    Dark areas 1st and 5th nail of left foot. Ongoing for a long time. Also has dark spot on bottom of same foot. Thought it was athletes foot, self treated with otc and seen by PCP. Was given clotrimazole  cream. Some resolution of symptoms, but returned. Was previously seen by Dr. Celia Coles for toenail fungus and was given oral terbinafine . She chose not to start that due to possibility of issues with liver. Not diabetic.  No anticoag.    HPI: 53 y.o. female presents today with concern of nail fungus on the left 1st and 5th toenails.  She saw long, dark brown streaks within the nails.  Denies injury.  She also has concern of some itching and dry skin to the plantar aspect of the left foot.  She had used prescription clotrimazole  by her primary care physician and noted some temporary improvement.  She is looking to see if there is any other medication which could completely resolve this.  Past Medical History:  Diagnosis Date   Anemia    Fibrocystic breast    GERD (gastroesophageal reflux disease)    Heart murmur    Inclusion cyst of vulva    Labial abscess 2011   Primary hypertension 11/05/2022   Sinus disease    TMJ arthralgia 2012    Past Surgical History:  Procedure Laterality Date   KNEE SURGERY  10/22/1988   left   WISDOM TOOTH EXTRACTION  10/22/1989    Allergies  Allergen Reactions   Shellfish Allergy Hives and Swelling   Fish Allergy    Penicillins     Does not remember possibly when child   Tamiflu  [Oseltamivir  Phosphate] Rash    Physical Exam: Palpable pedal pulses are noted.  No edema is appreciated.  The hallux nails have longitudinal dark brown streaks within them along the medial and lateral margins.  There is no pain with compression of the nails.  There is no subungual debris appreciated.  The dark streaks do not extend onto the proximal nail fold or beyond the hyponychium.  There is mild flaking and dry skin  to the plantar aspect of the left midfoot and heel.  No erythema or blistering formation is noted.  Epicritic sensation is intact  Assessment/Plan of Care: 1. Nail fungus   2. Tinea pedis of left foot      Meds ordered this encounter  Medications   ciclopirox (LOPROX) 0.77 % cream    Sig: Apply to both feet and between toes twice daily for 4 weeks.    Dispense:  30 g    Refill:  1   Discussed clinical findings with patient today.  Prescription for ciclopirox cream was sent to her pharmacy to apply to the skin on the plantar aspect of the left arch and heel daily.  Informed the patient that the longitudinal melanin streaks on the hallux nails and the discoloration to the entire fifth toenails are typically a benign condition.  This does not appear to be fungus.  This is more commonly seen in darker skinned individuals.  The patient was informed that if the dark streaks extend beyond the nail onto the skin, then this is concerning for melanoma.  Some clippings of the toenails were obtained and sent [facility] for [facility].  Will rule out fungal involvement.   Joe Murders, DPM, FACFAS [facility]     [address]  [place], [place] [address]                Office [phone]  Fax [phone]

## 2024-03-01 ENCOUNTER — Other Ambulatory Visit: Payer: Self-pay | Admitting: Family Medicine

## 2024-03-01 DIAGNOSIS — I10 Essential (primary) hypertension: Secondary | ICD-10-CM

## 2024-03-03 NOTE — Telephone Encounter (Signed)
 Requested Prescriptions  Pending Prescriptions Disp Refills   amLODipine  (NORVASC ) 5 MG tablet [Pharmacy Med Name: amLODIPine  BESYLATE 5 MG TAB] 90 tablet 0    Sig: TAKE 1 TABLET BY MOUTH DAILY     Cardiovascular: Calcium Channel Blockers 2 Failed - 03/03/2024 12:59 PM      Failed - Last BP in normal range    BP Readings from Last 1 Encounters:  08/06/23 (!) 145/77         Failed - Valid encounter within last 6 months    Recent Outpatient Visits           7 months ago Annual visit for general adult medical examination with abnormal findings   [facility] [place]. [facility] Joaquin Mulberry, MD   1 year ago Primary hypertension   [facility] [place] [facility] [place] [facility] [place]. [facility] Joaquin Mulberry, MD   1 year ago Primary hypertension   [facility] [place] [facility] [place]. [facility] Joaquin Mulberry, MD   1 year ago Annual physical exam   [facility] [facility] [place] [facility] [place]. [facility] Joaquin Mulberry, MD   2 years ago Annual physical exam   [facility] [facility] [place] [facility] [place]. [facility] Joaquin Mulberry, MD       Future Appointments             In 3 weeks Joaquin Mulberry, MD [facility] - [facility] Dept Of [place]. [facility]            Passed - Last Heart Rate in normal range    Pulse Readings from Last 1 Encounters:  08/06/23 63

## 2024-03-17 ENCOUNTER — Ambulatory Visit: Payer: Self-pay | Admitting: Podiatry

## 2024-03-24 ENCOUNTER — Ambulatory Visit: Attending: Family Medicine | Admitting: Family Medicine

## 2024-03-24 VITALS — BP 120/77 | HR 59 | Ht 63.0 in | Wt 177.2 lb

## 2024-03-24 DIAGNOSIS — I10 Essential (primary) hypertension: Secondary | ICD-10-CM

## 2024-03-24 DIAGNOSIS — Z23 Encounter for immunization: Secondary | ICD-10-CM | POA: Diagnosis not present

## 2024-03-24 DIAGNOSIS — Z131 Encounter for screening for diabetes mellitus: Secondary | ICD-10-CM

## 2024-03-24 MED ORDER — AMLODIPINE BESYLATE 5 MG PO TABS: 5.0000 mg | ORAL_TABLET | Freq: Every day | ORAL | 1 refills | Status: DC

## 2024-03-24 NOTE — Progress Notes (Signed)
 Subjective:  Patient ID: Allison Farrell, female    DOB: Nov 25, 1970  Age: 53 y.o. MRN: [id number]  CC: Medical Management of Chronic Issues (No concerns)     Discussed the use of AI scribe software for clinical note transcription with the patient, who gave verbal consent to proceed.  History of Present Illness Mashawn Brazil is a 53 year old female with hypertension who presents for a follow-up visit.  Her blood pressure is well-controlled at 120/77 mmHg on amlodipine  5 mg daily, with no side effects such as ankle swelling or calf pain. Lab tests from 07/2023 show normal kidney and liver function, normal cholesterol levels, and no signs of anemia or diabetes. She has lost weight from 188 lbs to 177 lbs since April through dietary changes and regular exercise. She uses the [employer] to guide her food choices. She is satisfied with ciclopirox  treatment for meant of toenail fungus which she received from podiatry. She is due for the shingles vaccine and has questions about it.      Past Medical History:  Diagnosis Date   Anemia    Fibrocystic breast    GERD (gastroesophageal reflux disease)    Heart murmur    Inclusion cyst of vulva    Labial abscess 2011   Primary hypertension 11/05/2022   Sinus disease    TMJ arthralgia 2012    Past Surgical History:  Procedure Laterality Date   KNEE SURGERY  10/22/1988   left   WISDOM TOOTH EXTRACTION  10/22/1989    Family History  Problem Relation Age of Onset   Breast cancer Mother 59   Cancer Mother    Lung cancer Father    Diabetes Father    Cancer Father        lung   Hypertension Father    Kidney disease Paternal Grandmother    Stroke Paternal Grandmother    Cancer Brother    Stroke Maternal Grandmother    Bladder Cancer Brother    Stroke Maternal Aunt    Colon cancer Neg Hx    Colon polyps Neg Hx    Esophageal cancer Neg Hx    Stomach cancer Neg Hx    Rectal cancer Neg Hx     Social History   Socioeconomic History    Marital status: Single    Spouse name: Not on file   Number of children: Not on file   Years of education: Not on file   Highest education level: Master's degree (e.g., MA, MS, MEng, MEd, MSW, MBA)  Occupational History   Not on file  Tobacco Use   Smoking status: Never   Smokeless tobacco: Never  Substance and Sexual Activity   Alcohol use: Yes    Alcohol/week: 3.0 standard drinks of alcohol    Types: 3 Standard drinks or equivalent per week    Comment: occasionally   Drug use: No   Sexual activity: Not Currently    Birth control/protection: Abstinence  Other Topics Concern   Not on file  Social History Narrative   Not on file   Social Drivers of Health   Financial Resource Strain: Medium Risk (03/24/2024)   Overall Financial Resource Strain (CARDIA)    Difficulty of Paying Living Expenses: Somewhat hard  Food Insecurity: Patient Declined (03/24/2024)   Hunger Vital Sign    Worried About Running Out of Food in the Last Year: Patient declined    Ran Out of Food in the Last Year: Patient declined  Transportation Needs: No Transportation Needs (  03/24/2024)   PRAPARE - Administrator, Civil Service (Medical): No    Lack of Transportation (Non-Medical): No  Physical Activity: Insufficiently Active (03/24/2024)   Exercise Vital Sign    Days of Exercise per Week: 3 days    Minutes of Exercise per Session: 40 min  Stress: No Stress Concern Present (03/24/2024)   [employer] of Occupational Health - Occupational Stress Questionnaire    Feeling of Stress : Not at all  Social Connections: Moderately Isolated (03/24/2024)   Social Connection and Isolation Panel [NHANES]    Frequency of Communication with Friends and Family: Three times a week    Frequency of Social Gatherings with Friends and Family: Once a week    Attends Religious Services: Never    Database administrator or Organizations: Yes    Attends Engineer, structural: 1 to 4 times per year    Marital  Status: Never married    Allergies  Allergen Reactions   Shellfish Allergy Hives and Swelling   Fish Allergy    Penicillins     Does not remember possibly when child   Tamiflu  [Oseltamivir  Phosphate] Rash    Outpatient Medications Prior to Visit  Medication Sig Dispense Refill   ciclopirox  (LOPROX ) 0.77 % cream Apply to both feet and between toes twice daily for 4 weeks. 30 g 1   amLODipine  (NORVASC ) 5 MG tablet TAKE 1 TABLET BY MOUTH DAILY 90 tablet 0   clotrimazole  (LOTRIMIN ) 1 % cream Apply 1 Application topically 2 (two) times daily. (Patient not taking: Reported on 03/24/2024) 45 g 1   No facility-administered medications prior to visit.     ROS Review of Systems  Constitutional:  Negative for activity change and appetite change.  HENT:  Negative for sinus pressure and sore throat.   Respiratory:  Negative for chest tightness, shortness of breath and wheezing.   Cardiovascular:  Negative for chest pain and palpitations.  Gastrointestinal:  Negative for abdominal distention, abdominal pain and constipation.  Genitourinary: Negative.   Musculoskeletal: Negative.   Psychiatric/Behavioral:  Negative for behavioral problems and dysphoric mood.     Objective:  BP 120/77   Pulse (!) 59   Ht 5\' 3"  (1.6 m)   Wt 177 lb 3.2 oz (80.4 kg)   SpO2 99%   BMI 31.39 kg/m      03/24/2024    2:52 PM 08/29/2023    8:49 AM 08/06/2023    3:52 PM  BP/Weight  Systolic BP 120  119  Diastolic BP 77  77  Wt. (Lbs) 177.2 177.6   BMI 31.39 kg/m2 31.46 kg/m2     Wt Readings from Last 3 Encounters:  03/24/24 177 lb 3.2 oz (80.4 kg)  08/29/23 177 lb 9.6 oz (80.6 kg)  08/06/23 177 lb 9.6 oz (80.6 kg)     Physical Exam Constitutional:      Appearance: She is well-developed.  Cardiovascular:     Rate and Rhythm: Normal rate.     Heart sounds: Normal heart sounds. No murmur heard. Pulmonary:     Effort: Pulmonary effort is normal.     Breath sounds: Normal breath sounds. No wheezing  or rales.  Chest:     Chest wall: No tenderness.  Abdominal:     General: Bowel sounds are normal. There is no distension.     Palpations: Abdomen is soft. There is no mass.     Tenderness: There is no abdominal tenderness.  Musculoskeletal:  General: Normal range of motion.     Right lower leg: No edema.     Left lower leg: No edema.  Neurological:     Mental Status: She is alert and oriented to person, place, and time.  Psychiatric:        Mood and Affect: Mood normal.        Latest Ref Rng & Units 08/06/2023    4:03 PM 08/01/2022    3:58 PM 06/19/2021   11:49 AM  CMP  Glucose 70 - 99 mg/dL 82  161  93   BUN 6 - 24 mg/dL 10  9  9    Creatinine 0.57 - 1.00 mg/dL 0.96  0.45  4.09   Sodium 134 - 144 mmol/L 140  141  139   Potassium 3.5 - 5.2 mmol/L 4.2  3.4  4.0   Chloride 96 - 106 mmol/L 105  106  105   CO2 20 - 29 mmol/L 21  24  23    Calcium 8.7 - 10.2 mg/dL 9.2  9.1  9.5   Total Protein 6.0 - 8.5 g/dL 7.1  6.8  7.3   Total Bilirubin 0.0 - 1.2 mg/dL 0.5  0.2  0.3   Alkaline Phos 44 - 121 IU/L 57  57  60   AST 0 - 40 IU/L 17  18  19    ALT 0 - 32 IU/L 9  11  15      Lipid Panel     Component Value Date/Time   CHOL 185 08/06/2023 1603   TRIG 44 08/06/2023 1603   HDL 70 08/06/2023 1603   CHOLHDL 3.0 06/19/2021 1149   LDLCALC 106 (H) 08/06/2023 1603    CBC    Component Value Date/Time   WBC 5.0 08/06/2023 1603   WBC 4.7 05/10/2020 1106   RBC 5.02 08/06/2023 1603   RBC 5.21 (H) 05/10/2020 1106   HGB 11.9 08/06/2023 1603   HCT 38.1 08/06/2023 1603   PLT 281 08/06/2023 1603   MCV 76 (L) 08/06/2023 1603   MCH 23.7 (L) 08/06/2023 1603   MCH 23.8 (A) 02/09/2016 0855   MCH 22.7 (L) 10/31/2015 1256   MCHC 31.2 (L) 08/06/2023 1603   MCHC 31.9 05/10/2020 1106   RDW 12.7 08/06/2023 1603   LYMPHSABS 2.2 08/06/2023 1603   MONOABS 0.4 05/10/2020 1106   EOSABS 0.1 08/06/2023 1603   BASOSABS 0.0 08/06/2023 1603    Lab Results  Component Value Date   HGBA1C 5.5  08/06/2023      1. Primary hypertension Controlled Continue current regimen Counseled on blood pressure goal of less than 130/80, low-sodium, DASH diet, medication compliance, 150 minutes of moderate intensity exercise per week. Discussed medication compliance, adverse effects. - LP+Non-HDL Cholesterol - CMP14+EGFR - CBC with Differential/Platelet - amLODipine  (NORVASC ) 5 MG tablet; Take 1 tablet (5 mg total) by mouth daily.  Dispense: 90 tablet; Refill: 1  2. Screening for diabetes mellitus (Primary) - Hemoglobin A1c   Meds ordered this encounter  Medications   amLODipine  (NORVASC ) 5 MG tablet    Sig: Take 1 tablet (5 mg total) by mouth daily.    Dispense:  90 tablet    Refill:  1    Follow-up: Return in about 6 months (around 09/23/2024) for Medical conditions with PCP, 2nd dose shingrix.       Joaquin Mulberry, MD, FAAFP. [facility] [place], [place] [phone]   03/24/2024, 3:30 PM

## 2024-03-24 NOTE — Patient Instructions (Addendum)
 VISIT SUMMARY:  Today, we reviewed your blood pressure management, ongoing treatment for a skin condition, and discussed the shingles vaccine. Your blood pressure is well-controlled, and your recent lab results are normal. You have also made great progress with your weight loss through diet and exercise.  YOUR PLAN:  -PRIMARY HYPERTENSION: Primary hypertension means high blood pressure without a known secondary cause. Your blood pressure is well-controlled at 120/77 mmHg with your current medication, amlodipine  5 mg daily. Continue taking this medication as prescribed. We will also order labs to check your kidney and liver function, cholesterol levels, and screen for diabetes.  -GENERAL HEALTH MAINTENANCE: You are due for the shingles vaccine, which helps prevent shingles, a painful rash caused by the varicella-zoster virus. We discussed the Shingrix vaccine schedule and its side effects. You will receive the first dose today and should schedule the second dose in 6 months.  INSTRUCTIONS:  Please continue taking your amlodipine  5 mg daily and using ciclopirox  as directed. We will order labs to check your kidney and liver function, cholesterol levels, and screen for diabetes. You will receive the first dose of the Shingrix vaccine today; please schedule the second dose in 6 months. Follow up with a dermatologist if needed for your skin condition.

## 2024-03-25 ENCOUNTER — Ambulatory Visit: Payer: Self-pay | Admitting: Family Medicine

## 2024-03-25 LAB — CMP14+EGFR
ALT: 17 IU/L (ref 0–32)
AST: 19 IU/L (ref 0–40)
Albumin: 4 g/dL (ref 3.9–4.9)
Alkaline Phosphatase: 54 IU/L (ref 44–121)
BUN/Creatinine Ratio: 15 (ref 9–23)
BUN: 13 mg/dL (ref 6–24)
Bilirubin Total: 0.3 mg/dL (ref 0.0–1.2)
CO2: 21 mmol/L (ref 20–29)
Calcium: 9.6 mg/dL (ref 8.7–10.2)
Chloride: 105 mmol/L (ref 96–106)
Creatinine, Ser: 0.87 mg/dL (ref 0.57–1.00)
Globulin, Total: 3.4 g/dL (ref 1.5–4.5)
Glucose: 86 mg/dL (ref 70–99)
Potassium: 4.6 mmol/L (ref 3.5–5.2)
Sodium: 142 mmol/L (ref 134–144)
Total Protein: 7.4 g/dL (ref 6.0–8.5)
eGFR: 81 mL/min/{1.73_m2} (ref >59)

## 2024-03-25 LAB — CBC WITH DIFFERENTIAL/PLATELET
Basophils Absolute: 0 10*3/uL (ref 0.0–0.2)
Basos: 1 %
EOS (ABSOLUTE): 0.1 10*3/uL (ref 0.0–0.4)
Eos: 2 %
Hematocrit: 39.9 % (ref 34.0–46.6)
Hemoglobin: 12 g/dL (ref 11.1–15.9)
Immature Grans (Abs): 0 10*3/uL (ref 0.0–0.1)
Immature Granulocytes: 0 %
Lymphocytes Absolute: 2.2 10*3/uL (ref 0.7–3.1)
Lymphs: 56 %
MCH: 23.1 pg — ABNORMAL LOW (ref 26.6–33.0)
MCHC: 30.1 g/dL — ABNORMAL LOW (ref 31.5–35.7)
MCV: 77 fL — ABNORMAL LOW (ref 79–97)
Monocytes Absolute: 0.3 10*3/uL (ref 0.1–0.9)
Monocytes: 8 %
Neutrophils Absolute: 1.3 10*3/uL — ABNORMAL LOW (ref 1.4–7.0)
Neutrophils: 33 %
Platelets: 297 10*3/uL (ref 150–450)
RBC: 5.2 x10E6/uL (ref 3.77–5.28)
RDW: 13 % (ref 11.7–15.4)
WBC: 3.9 10*3/uL (ref 3.4–10.8)

## 2024-03-25 LAB — LP+NON-HDL CHOLESTEROL
Cholesterol, Total: 203 mg/dL — ABNORMAL HIGH (ref 100–199)
HDL: 50 mg/dL (ref >39)
LDL Chol Calc (NIH): 146 mg/dL — ABNORMAL HIGH (ref 0–99)
Total Non-HDL-Chol (LDL+VLDL): 153 mg/dL — ABNORMAL HIGH (ref 0–129)
Triglycerides: 41 mg/dL (ref 0–149)
VLDL Cholesterol Cal: 7 mg/dL (ref 5–40)

## 2024-03-25 LAB — HEMOGLOBIN A1C
Est. average glucose Bld gHb Est-mCnc: 111 mg/dL
Hgb A1c MFr Bld: 5.5 % (ref 4.8–5.6)

## 2024-04-27 ENCOUNTER — Ambulatory Visit: Admitting: Podiatry

## 2024-06-12 ENCOUNTER — Telehealth: Payer: Self-pay | Admitting: Family Medicine

## 2024-06-12 NOTE — Telephone Encounter (Signed)
 Let me know if I need to sch appt     Copied from CRM #[id number]. 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 She had a CPE 08/06/2023. She also wants to know the date of her last TB test. Please f/u with patient

## 2024-06-12 NOTE — Telephone Encounter (Signed)
Patient has been sent a MyChart message.

## 2024-06-18 ENCOUNTER — Ambulatory Visit: Attending: Family Medicine | Admitting: Family Medicine

## 2024-06-18 ENCOUNTER — Encounter: Payer: Self-pay | Admitting: Family Medicine

## 2024-06-18 VITALS — BP 125/75 | HR 60 | Ht 63.0 in | Wt 174.2 lb

## 2024-06-18 DIAGNOSIS — Z021 Encounter for pre-employment examination: Secondary | ICD-10-CM

## 2024-06-18 DIAGNOSIS — I10 Essential (primary) hypertension: Secondary | ICD-10-CM

## 2024-06-18 DIAGNOSIS — Z111 Encounter for screening for respiratory tuberculosis: Secondary | ICD-10-CM

## 2024-06-18 DIAGNOSIS — R600 Localized edema: Secondary | ICD-10-CM | POA: Diagnosis not present

## 2024-06-18 NOTE — Patient Instructions (Signed)
 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 We also discussed your current medications, vaccinations, and general health maintenance.  YOUR PLAN:  -PRIMARY HYPERTENSION WITH AMLODIPINE -INDUCED ANKLE EDEMA: You have high blood pressure, and your medication, amlodipine , is causing mild swelling in your ankles. This swelling is not severe enough to change your medication at this time. We discussed potential alternatives like diuretics, but they may cause other issues like electrolyte imbalances. Please continue taking amlodipine , and we will discuss alternatives if the swelling gets worse.  -GENERAL HEALTH MAINTENANCE: You are due for your pneumonia and shingles vaccines. You prefer to wait until December for the second dose of the shingles vaccine due to soreness concerns. We will administer the second Shingrix  dose in December. You have declined the pneumonia vaccine at this time.  INSTRUCTIONS:  Your next appointment is scheduled for December. We have ordered the TB Gold test, and the results will be available in 48 hours. We will inform you when the results are ready so you can pick up the form.

## 2024-06-18 NOTE — Progress Notes (Signed)
 Subjective:  Patient ID: Allison Farrell, female    DOB: 1971-05-15  Age: 53 y.o. MRN: [id number]  CC: PAPERWORK     Discussed the use of AI scribe software for clinical note transcription with the patient, who gave verbal consent to proceed.  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 She retired in October of last year and became available for work in March. She has no issues with vision or hearing and no limitations in lifting or carrying. She is due for a pneumonia vaccine and has received the first dose of the shingles vaccine in June. She experiences swelling in her knees and ankles, which she attributes to amlodipine . The swelling has reduced. She engages in physical activity at the gym every other day, including weekends, and has noticed weight loss.    Past Medical History:  Diagnosis Date   Anemia    Fibrocystic breast    GERD (gastroesophageal reflux disease)    Heart murmur    Inclusion cyst of vulva    Labial abscess 2011   Primary hypertension 11/05/2022   Sinus disease    TMJ arthralgia 2012    Past Surgical History:  Procedure Laterality Date   KNEE SURGERY  10/22/1988   left   WISDOM TOOTH EXTRACTION  10/22/1989    Family History  Problem Relation Age of Onset   Breast cancer Mother 72   Cancer Mother    Lung cancer Father    Diabetes Father    Cancer Father        lung   Hypertension Father    Kidney disease Paternal Grandmother    Stroke Paternal Grandmother    Cancer Brother    Stroke Maternal Grandmother    Bladder Cancer Brother    Stroke Maternal Aunt    Colon cancer Neg Hx    Colon polyps Neg Hx    Esophageal cancer Neg Hx    Stomach cancer Neg Hx    Rectal cancer Neg Hx     Social History    Socioeconomic History   Marital status: Single    Spouse name: Not on file   Number of children: Not on file   Years of education: Not on file   Highest education level: Master's degree (e.g., MA, MS, MEng, MEd, MSW, MBA)  Occupational History   Not on file  Tobacco Use   Smoking status: Never   Smokeless tobacco: Never  Substance and Sexual Activity   Alcohol use: Yes    Alcohol/week: 3.0 standard drinks of alcohol    Types: 3 Standard drinks or equivalent per week    Comment: occasionally   Drug use: No   Sexual activity: Not Currently    Birth control/protection: Abstinence  Other Topics Concern   Not on file  Social History Narrative   Not on file   Social Drivers of Health   Financial Resource Strain: Medium Risk (03/24/2024)   Overall Financial Resource Strain (CARDIA)    Difficulty of Paying Living Expenses: Somewhat hard  Food Insecurity: Patient Declined (03/24/2024)   Hunger Vital Sign    Worried About Running Out of Food in the Last Year: Patient declined    Ran Out of Food in the Last Year: Patient declined  Transportation Needs: No Transportation Needs (03/24/2024)  PRAPARE - Administrator, Civil Service (Medical): No    Lack of Transportation (Non-Medical): No  Physical Activity: Insufficiently Active (03/24/2024)   Exercise Vital Sign    Days of Exercise per Week: 3 days    Minutes of Exercise per Session: 40 min  Stress: No Stress Concern Present (03/24/2024)   [employer] of Occupational Health - Occupational Stress Questionnaire    Feeling of Stress : Not at all  Social Connections: Moderately Isolated (03/24/2024)   Social Connection and Isolation Panel    Frequency of Communication with Friends and Family: Three times a week    Frequency of Social Gatherings with Friends and Family: Once a week    Attends Religious Services: Never    Database administrator or Organizations: Yes    Attends Engineer, structural: 1 to 4 times per  year    Marital Status: Never married    Allergies  Allergen Reactions   Shellfish Allergy Hives and Swelling   Fish Allergy    Penicillins     Does not remember possibly when child   Tamiflu  [Oseltamivir  Phosphate] Rash    Outpatient Medications Prior to Visit  Medication Sig Dispense Refill   amLODipine  (NORVASC ) 5 MG tablet Take 1 tablet (5 mg total) by mouth daily. 90 tablet 1   ciclopirox  (LOPROX ) 0.77 % cream Apply to both feet and between toes twice daily for 4 weeks. 30 g 1   clotrimazole  (LOTRIMIN ) 1 % cream Apply 1 Application topically 2 (two) times daily. (Patient not taking: Reported on 03/24/2024) 45 g 1   No facility-administered medications prior to visit.     ROS Review of Systems  Constitutional:  Negative for activity change and appetite change.  HENT:  Negative for sinus pressure and sore throat.   Respiratory:  Negative for chest tightness, shortness of breath and wheezing.   Cardiovascular:  Positive for leg swelling. Negative for chest pain and palpitations.  Gastrointestinal:  Negative for abdominal distention, abdominal pain and constipation.  Genitourinary: Negative.   Musculoskeletal: Negative.   Psychiatric/Behavioral:  Negative for behavioral problems and dysphoric mood.     Objective:  BP 125/75   Pulse 60   Ht 5' 3 (1.6 m)   Wt 174 lb 3.2 oz (79 kg)   SpO2 100%   BMI 30.86 kg/m      06/18/2024    9:17 AM 03/24/2024    2:52 PM 08/29/2023    8:49 AM  BP/Weight  Systolic BP 125 120   Diastolic BP 75 77   Wt. (Lbs) 174.2 177.2 177.6  BMI 30.86 kg/m2 31.39 kg/m2 31.46 kg/m2      Physical Exam Constitutional:      Appearance: She is well-developed.  Cardiovascular:     Rate and Rhythm: Normal rate.     Heart sounds: Normal heart sounds. No murmur heard. Pulmonary:     Effort: Pulmonary effort is normal.     Breath sounds: Normal breath sounds. No wheezing or rales.  Chest:     Chest wall: No tenderness.  Abdominal:     General:  Bowel sounds are normal. There is no distension.     Palpations: Abdomen is soft. There is no mass.     Tenderness: There is no abdominal tenderness.  Musculoskeletal:        General: Normal range of motion.     Right lower leg: No edema.     Left lower leg: No edema.  Neurological:  Mental Status: She is alert and oriented to person, place, and time.  Psychiatric:        Mood and Affect: Mood normal.        Latest Ref Rng & Units 03/24/2024    3:37 PM 08/06/2023    4:03 PM 08/01/2022    3:58 PM  CMP  Glucose 70 - 99 mg/dL 86  82  886   BUN 6 - 24 mg/dL 13  10  9    Creatinine 0.57 - 1.00 mg/dL 9.12  9.28  9.19   Sodium 134 - 144 mmol/L 142  140  141   Potassium 3.5 - 5.2 mmol/L 4.6  4.2  3.4   Chloride 96 - 106 mmol/L 105  105  106   CO2 20 - 29 mmol/L 21  21  24    Calcium 8.7 - 10.2 mg/dL 9.6  9.2  9.1   Total Protein 6.0 - 8.5 g/dL 7.4  7.1  6.8   Total Bilirubin 0.0 - 1.2 mg/dL 0.3  0.5  0.2   Alkaline Phos 44 - 121 IU/L 54  57  57   AST 0 - 40 IU/L 19  17  18    ALT 0 - 32 IU/L 17  9  11      Lipid Panel     Component Value Date/Time   CHOL 203 (H) 03/24/2024 1537   TRIG 41 03/24/2024 1537   HDL 50 03/24/2024 1537   CHOLHDL 3.0 06/19/2021 1149   LDLCALC 146 (H) 03/24/2024 1537    CBC    Component Value Date/Time   WBC 3.9 03/24/2024 1537   WBC 4.7 05/10/2020 1106   RBC 5.20 03/24/2024 1537   RBC 5.21 (H) 05/10/2020 1106   HGB 12.0 03/24/2024 1537   HCT 39.9 03/24/2024 1537   PLT 297 03/24/2024 1537   MCV 77 (L) 03/24/2024 1537   MCH 23.1 (L) 03/24/2024 1537   MCH 23.8 (A) 02/09/2016 0855   MCH 22.7 (L) 10/31/2015 1256   MCHC 30.1 (L) 03/24/2024 1537   MCHC 31.9 05/10/2020 1106   RDW 13.0 03/24/2024 1537   LYMPHSABS 2.2 03/24/2024 1537   MONOABS 0.4 05/10/2020 1106   EOSABS 0.1 03/24/2024 1537   BASOSABS 0.0 03/24/2024 1537    Lab Results  Component Value Date   HGBA1C 5.5 03/24/2024       Assessment & Plan Preemployment physical Due for  pneumonia and shingles vaccines. Prefers to wait until December for second shingles dose due to soreness concerns. - Administer second Shingrix  dose in December. - Discussed pneumonia vaccine, declined.   Screening for tuberculosis TB Gold test ordered due to the fact that today is Thursday and PPD if placed they will need to be read maximum 72 hours which will follow on the weekend.   Primary hypertension with amlodipine -induced ankle edema Amlodipine  causing mild ankle edema, not severe enough to change medication. Discussed potential alternatives like diuretics, which may cause electrolyte imbalances. - Continue amlodipine  as she would like to remain on this. - Discuss alternatives if edema worsens.   Follow-Up Next appointment in December. TB Gold test ordered, results in 48 hours. Will inform her about form pickup post-results. - Order TB Gold test. - Schedule follow-up in December. - Inform when TB results available for form completion.     Healthcare maintenance Address pending vaccines at next visit  No orders of the defined types were placed in this encounter.   Follow-up: Return for previously scheduled appointment.  Corrina Sabin, MD, FAAFP. [facility] [place], [place] [phone]   06/18/2024, 9:47 AM

## 2024-06-22 LAB — QUANTIFERON-TB GOLD PLUS
QuantiFERON Mitogen Value: >10 [IU]/mL
QuantiFERON Nil Value: 0.02 [IU]/mL
QuantiFERON TB1 Ag Value: 0.07 [IU]/mL
QuantiFERON TB2 Ag Value: 0.05 [IU]/mL
QuantiFERON-TB Gold Plus: NEGATIVE

## 2024-06-23 ENCOUNTER — Ambulatory Visit: Payer: Self-pay | Admitting: Family Medicine

## 2024-06-23 NOTE — Telephone Encounter (Addendum)
 FYI, Patient came In the office today and picked up the TB test form.

## 2024-09-23 ENCOUNTER — Ambulatory Visit: Admitting: Family Medicine

## 2024-09-28 ENCOUNTER — Ambulatory Visit: Payer: Self-pay | Attending: Family Medicine

## 2024-09-28 DIAGNOSIS — Z23 Encounter for immunization: Secondary | ICD-10-CM | POA: Diagnosis not present

## 2024-09-28 NOTE — Progress Notes (Signed)
Shingrix vaccine administered per protocols.  Information sheet given. Patient denies and pain or discomfort at injection site. Tolerated injection well no reaction.

## 2024-11-26 ENCOUNTER — Other Ambulatory Visit: Payer: Self-pay | Admitting: Family Medicine

## 2024-11-26 DIAGNOSIS — I10 Essential (primary) hypertension: Secondary | ICD-10-CM
